# Patient Record
Sex: Male | Born: 1940 | Race: White | Hispanic: No | Marital: Married | Smoking: Never smoker
Health system: Southern US, Community
[De-identification: ages and names within clinical notes are randomized; demographics above are authoritative.]

## PROBLEM LIST (undated history)

## (undated) DIAGNOSIS — M1990 Unspecified osteoarthritis, unspecified site: Secondary | ICD-10-CM

## (undated) DIAGNOSIS — Z87442 Personal history of urinary calculi: Secondary | ICD-10-CM

## (undated) DIAGNOSIS — I639 Cerebral infarction, unspecified: Secondary | ICD-10-CM

## (undated) DIAGNOSIS — K219 Gastro-esophageal reflux disease without esophagitis: Secondary | ICD-10-CM

## (undated) DIAGNOSIS — E119 Type 2 diabetes mellitus without complications: Secondary | ICD-10-CM

## (undated) DIAGNOSIS — I10 Essential (primary) hypertension: Secondary | ICD-10-CM

## (undated) HISTORY — PX: BACK SURGERY: SHX140

## (undated) HISTORY — PX: THROAT SURGERY: SHX803

## (undated) HISTORY — PX: LITHOTRIPSY: SUR834

## (undated) HISTORY — PX: ROTATOR CUFF REPAIR: SHX139

---

## 1998-10-22 ENCOUNTER — Encounter: Payer: Self-pay | Admitting: *Deleted

## 1998-10-22 ENCOUNTER — Ambulatory Visit (HOSPITAL_COMMUNITY): Admission: RE | Admit: 1998-10-22 | Discharge: 1998-10-22 | Payer: Self-pay | Admitting: *Deleted

## 2004-11-24 ENCOUNTER — Ambulatory Visit (HOSPITAL_COMMUNITY): Admission: RE | Admit: 2004-11-24 | Discharge: 2004-11-24 | Payer: Self-pay | Admitting: Gastroenterology

## 2005-09-05 ENCOUNTER — Encounter: Admission: RE | Admit: 2005-09-05 | Discharge: 2005-09-05 | Payer: Self-pay | Admitting: Internal Medicine

## 2005-09-20 ENCOUNTER — Ambulatory Visit (HOSPITAL_BASED_OUTPATIENT_CLINIC_OR_DEPARTMENT_OTHER): Admission: RE | Admit: 2005-09-20 | Discharge: 2005-09-20 | Payer: Self-pay | Admitting: Orthopaedic Surgery

## 2006-07-20 ENCOUNTER — Ambulatory Visit (HOSPITAL_COMMUNITY): Admission: RE | Admit: 2006-07-20 | Discharge: 2006-07-20 | Payer: Self-pay | Admitting: Urology

## 2007-10-22 ENCOUNTER — Encounter: Admission: RE | Admit: 2007-10-22 | Discharge: 2007-10-22 | Payer: Self-pay | Admitting: Internal Medicine

## 2007-12-10 ENCOUNTER — Encounter: Admission: RE | Admit: 2007-12-10 | Discharge: 2007-12-10 | Payer: Self-pay | Admitting: Neurological Surgery

## 2010-10-09 ENCOUNTER — Encounter: Payer: Self-pay | Admitting: Internal Medicine

## 2011-01-24 ENCOUNTER — Other Ambulatory Visit: Payer: Self-pay | Admitting: Neurological Surgery

## 2011-01-24 DIAGNOSIS — M545 Low back pain: Secondary | ICD-10-CM

## 2011-01-27 ENCOUNTER — Ambulatory Visit
Admission: RE | Admit: 2011-01-27 | Discharge: 2011-01-27 | Disposition: A | Discharge status: Home / Self Care | Payer: Medicare Other | Source: Ambulatory Visit | Attending: Neurological Surgery | Admitting: Neurological Surgery

## 2011-01-27 DIAGNOSIS — M545 Low back pain: Secondary | ICD-10-CM

## 2011-02-04 NOTE — Op Note (Signed)
NAME:  [name], [name] NO.:  [id number]   MEDICAL RECORD NO.:  [id number]          PATIENT TYPE:  AMB   LOCATION:  ENDO                         FACILITY:  [facility]   PHYSICIAN:  Danise Edge, M.D.   DATE OF BIRTH:  May 23, 1941   DATE OF PROCEDURE:  11/24/2004  DATE OF DISCHARGE:                                 OPERATIVE REPORT   PROCEDURE INDICATIONS:  [name] is a 70 year old male born  07-20-1941. [name] is referred to me by Dr. Theressa Millard to  perform a screening colonoscopy with polypectomy to prevent colon cancer.   ENDOSCOPIST:  Danise Edge, M.D.   PREMEDICATION:  Versed 10 mg, Demerol 50 mg.   PROCEDURE:  After obtaining informed consent, [name] was placed in the  left lateral decubitus position. I administered intravenous Demerol and  intravenous Versed to achieve conscious sedation for the procedure. The  patient's blood pressure, oxygen saturation and cardiac rhythm were  monitored throughout the procedure and documented in the medical record.   Anal inspection and digital rectal exam were normal. The prostate was  nonnodular. The Olympus adjustable pediatric colonoscope was introduced into  the rectum and advanced to cecum. Colonic preparation for the exam today was  excellent.   Rectum normal.  Sigmoid colon and descending colon normal.  Splenic flexure normal.  Transverse colon normal.  Hepatic flexure normal.  Ascending colon normal.  Cecum and ileocecal valve normal.   ASSESSMENT:  Normal screening proctocolonoscopy to the cecum.     MJ/MEDQ  D:  11/24/2004  T:  11/24/2004  Job:  [id number]   cc:   Theressa Millard, M.D.  [address] [place]  [place] [address]  Fax: [phone]

## 2011-02-04 NOTE — Op Note (Signed)
NAME:  [name] NO.:  [id number]   MEDICAL RECORD NO.:  [id number]          PATIENT TYPE:  AMB   LOCATION:  DSC                          FACILITY:  [facility]   PHYSICIAN:  Lubertha Basque. Dalldorf, M.D.DATE OF BIRTH:  06/03/1941   DATE OF PROCEDURE:  09/20/2005  DATE OF DISCHARGE:                                 OPERATIVE REPORT   PREOPERATIVE DIAGNOSES:  1.  Left shoulder impingement.  2.  Left shoulder acromioclavicular joint degeneration.  3.  Left shoulder rotator cuff tear.   POSTOPERATIVE DIAGNOSES:  1.  Left shoulder impingement.  2.  Left shoulder acromioclavicular joint degeneration.  3.  Left shoulder partial rotator cuff tear.   PROCEDURES:  1.  Left shoulder arthroscopic acromioplasty.  2.  Left shoulder arthroscopic partial claviculectomy.  3.  Left shoulder arthroscopic debridement, partial-thickness rotator cuff      tear.   ANESTHESIA:  General and block.   ATTENDING SURGEON:  Lubertha Basque. Jerl Santos, M.D.   ASSISTANT:  Lindwood Qua, P.A.   INDICATIONS FOR PROCEDURE:  The patient is a 70 year old man with many  months of left shoulder pain after an injury in the garden.  This has  persisted despite oral anti-inflammatories and an injection, which did help  for several days.  At this point he has difficulty using his arm out to the  side and over head and cannot sleep on this side.  He is offered an  arthroscopy.  Informed operative consent was obtained after discussion of  the possible complications of and reaction to anesthesia and infection.  He  is status post a successful procedure on the opposite shoulder several years  ago.   DESCRIPTION OF PROCEDURE:  The patient was taken to the operating suite,  where a general anesthetic was applied without difficulty. He was also given  a block of the preanesthesia area.  He was positioned in beach-chair  position and prepped and draped in normal sterile fashion.  After the  administration of  preop IV Kefzol, an arthroscopy of the left shoulder was  performed through a total of two portals.  The glenohumeral joint showed no  degenerative changes, and the biceps tendon appeared intact throughout the  shoulder.  The rotator cuff did appear a bit irregular near the interval,  but no full-thickness tear could be seen.  In the subacromial space he had a  moderate amount of bursitis,  addressed with a thorough debridement.  He did  have a prominent subacromial morphology, addressed with an acromioplasty  back to a flat surface.  This was done with a bur in the lateral position  followed by transfer the bur the posterior position.  He also had a  prominence at the end of the clavicle which appeared to be impinging on the  rotator cuff, and this was removed performing a partial claviculectomy.  I  then examined the rotator cuff thoroughly.  With significant rotation of the  arm, a small area of partial-thickness tearing could be seen on the far  anterior portion of the supraspinatus attachment at the greater tuberosity.  This was  debrided.  This did not consist of more than 50% of the thickness  the rotator cuff.  This was small and appeared to be in an area of  impingement, and my feeling was that it did not warrant a formal repair.  As  a result, we did the debridement and obviously the decompression.  The  shoulder was thoroughly irrigated.  Simple sutures of nylon were used to  loosely reapproximate the portals, followed by Adaptic and dry gauze  dressing with tape.  Estimated blood loss and intraoperative fluids can be  obtained from anesthesia records.   DISPOSITION:  The patient was extubated in the operating room and taken to  the recovery room in stable addition.  Plans were for him to go home the  same day and follow up in the office in less than a week.  I will contact  him by phone tonight.      Lubertha Basque Jerl Santos, M.D.  Electronically Signed     PGD/MEDQ  D:   09/20/2005  T:  09/20/2005  Job:  [id number]

## 2012-05-29 ENCOUNTER — Other Ambulatory Visit: Payer: Self-pay | Admitting: Neurosurgery

## 2012-05-29 DIAGNOSIS — M48061 Spinal stenosis, lumbar region without neurogenic claudication: Secondary | ICD-10-CM

## 2012-06-03 ENCOUNTER — Ambulatory Visit
Admission: RE | Admit: 2012-06-03 | Discharge: 2012-06-03 | Disposition: A | Discharge status: Home / Self Care | Payer: Medicare Other | Source: Ambulatory Visit | Attending: Neurosurgery | Admitting: Neurosurgery

## 2012-06-03 DIAGNOSIS — M48061 Spinal stenosis, lumbar region without neurogenic claudication: Secondary | ICD-10-CM

## 2012-06-19 ENCOUNTER — Other Ambulatory Visit: Payer: Self-pay | Admitting: Neurosurgery

## 2012-07-17 ENCOUNTER — Encounter (HOSPITAL_COMMUNITY): Payer: Self-pay | Admitting: Pharmacy Technician

## 2012-07-24 ENCOUNTER — Encounter (HOSPITAL_COMMUNITY): Payer: Self-pay

## 2012-07-24 ENCOUNTER — Encounter (HOSPITAL_COMMUNITY)
Admission: RE | Admit: 2012-07-24 | Discharge: 2012-07-24 | Disposition: A | Discharge status: Home / Self Care | Payer: Medicare Other | Source: Ambulatory Visit | Attending: Anesthesiology | Admitting: Anesthesiology

## 2012-07-24 ENCOUNTER — Encounter (HOSPITAL_COMMUNITY)
Admission: RE | Admit: 2012-07-24 | Discharge: 2012-07-24 | Disposition: A | Discharge status: Home / Self Care | Payer: Medicare Other | Source: Ambulatory Visit | Attending: Neurosurgery | Admitting: Neurosurgery

## 2012-07-24 HISTORY — DX: Type 2 diabetes mellitus without complications: E11.9

## 2012-07-24 HISTORY — DX: Essential (primary) hypertension: I10

## 2012-07-24 HISTORY — DX: Gastro-esophageal reflux disease without esophagitis: K21.9

## 2012-07-24 LAB — BASIC METABOLIC PANEL
BUN: 18 mg/dL (ref 6–23)
CO2: 30 mEq/L (ref 19–32)
Calcium: 9.9 mg/dL (ref 8.4–10.5)
Chloride: 95 mEq/L — ABNORMAL LOW (ref 96–112)
Creatinine, Ser: 0.99 mg/dL (ref 0.50–1.35)
GFR calc Af Amer: >90 mL/min (ref >90)
GFR calc non Af Amer: 81 mL/min — ABNORMAL LOW (ref >90)
Glucose, Bld: 149 mg/dL — ABNORMAL HIGH (ref 70–99)
Potassium: 3.8 mEq/L (ref 3.5–5.1)
Sodium: 136 mEq/L (ref 135–145)

## 2012-07-24 LAB — CBC
HCT: 45.4 % (ref 39.0–52.0)
Hemoglobin: 16.1 g/dL (ref 13.0–17.0)
MCH: 31.2 pg (ref 26.0–34.0)
MCHC: 35.5 g/dL (ref 30.0–36.0)
MCV: 88 fL (ref 78.0–100.0)
Platelets: 207 10*3/uL (ref 150–400)
RBC: 5.16 MIL/uL (ref 4.22–5.81)
RDW: 12.9 % (ref 11.5–15.5)
WBC: 8.9 10*3/uL (ref 4.0–10.5)

## 2012-07-24 LAB — TYPE AND SCREEN
ABO/RH(D): O POS
Antibody Screen: NEGATIVE

## 2012-07-24 LAB — SURGICAL PCR SCREEN
MRSA, PCR: NEGATIVE
Staphylococcus aureus: POSITIVE — AB

## 2012-07-24 LAB — ABO/RH: ABO/RH(D): O POS

## 2012-07-24 NOTE — Pre-Procedure Instructions (Signed)
20 [name]  07/24/2012   Your procedure is scheduled on:  Wednesday August 01, 2012  Report to [facility] at 6:00 AM.  Call this number if you have problems the morning of surgery: [phone]   Remember:   Do not eat food or drink :After Midnight.      Take these medicines the morning of surgery with A SIP OF WATER: amlodipine,    Do not wear jewelry, make-up or nail polish.  Do not wear lotions, powders, or perfumes.  Do not shave 48 hours prior to surgery. Men may shave face and neck.  Do not bring valuables to the hospital.  Contacts, dentures or bridgework may not be worn into surgery.  Leave suitcase in the car. After surgery it may be brought to your room.  For patients admitted to the hospital, checkout time is 11:00 AM the day of discharge.   Patients discharged the day of surgery will not be allowed to drive home.  Name and phone number of your driver: family / friend  Special Instructions: Shower using CHG 2 nights before surgery and the night before surgery.  If you shower the day of surgery use CHG.  Use special wash - you have one bottle of CHG for all showers.  You should use approximately 1/3 of the bottle for each shower.   Please read over the following fact sheets that you were given: Pain Booklet, Coughing and Deep Breathing, Blood Transfusion Information, MRSA Information and Surgical Site Infection Prevention

## 2012-07-25 NOTE — Consult Note (Signed)
Anesthesia chart review: Patient is a 71 year old male scheduled for L5 Gill procedure with L5-S1 PLIF on 08/01/2012 by Dr. Newell Coral. History includes nonsmoker, hypertension, kidney stones, diabetes mellitus type 2, GERD, prior throat and rotator cuff surgeries.  EKG on 07/24/2012 showed sinus rhythm with first-degree AV block. It was not felt significantly changed from his prior EKG on 09/15/2005.  Chest x-ray on 07/24/2012 showed no active disease of the chest.  Labs noted.  Anticipate he can proceed as planned.  Shonna Chock, PA-C

## 2012-07-31 MED ORDER — CEFAZOLIN SODIUM-DEXTROSE 2-3 GM-% IV SOLR
2.0000 g | INTRAVENOUS | Status: AC
  Administered 2012-08-01: 2 g via INTRAVENOUS
  Filled 2012-07-31: qty 50

## 2012-08-01 ENCOUNTER — Inpatient Hospital Stay (HOSPITAL_COMMUNITY): Payer: Medicare Other

## 2012-08-01 ENCOUNTER — Inpatient Hospital Stay (HOSPITAL_COMMUNITY): Payer: Medicare Other | Admitting: Vascular Surgery

## 2012-08-01 ENCOUNTER — Encounter (HOSPITAL_COMMUNITY): Payer: Self-pay | Admitting: Vascular Surgery

## 2012-08-01 ENCOUNTER — Encounter (HOSPITAL_COMMUNITY)
Admission: RE | Disposition: A | Discharge status: Home / Self Care | Payer: Self-pay | Source: Ambulatory Visit | Attending: Neurosurgery

## 2012-08-01 ENCOUNTER — Encounter (HOSPITAL_COMMUNITY): Payer: Self-pay | Admitting: Anesthesiology

## 2012-08-01 ENCOUNTER — Encounter (HOSPITAL_COMMUNITY): Payer: Self-pay | Admitting: *Deleted

## 2012-08-01 ENCOUNTER — Inpatient Hospital Stay (HOSPITAL_COMMUNITY)
Admission: RE | Admit: 2012-08-01 | Discharge: 2012-08-03 | DRG: 460 | Disposition: A | Discharge status: Home / Self Care | Payer: Medicare Other | Source: Ambulatory Visit | Attending: Neurosurgery | Admitting: Neurosurgery

## 2012-08-01 DIAGNOSIS — I10 Essential (primary) hypertension: Secondary | ICD-10-CM | POA: Diagnosis present

## 2012-08-01 DIAGNOSIS — M4310 Spondylolisthesis, site unspecified: Secondary | ICD-10-CM | POA: Diagnosis present

## 2012-08-01 DIAGNOSIS — Z79899 Other long term (current) drug therapy: Secondary | ICD-10-CM

## 2012-08-01 DIAGNOSIS — E119 Type 2 diabetes mellitus without complications: Secondary | ICD-10-CM | POA: Diagnosis present

## 2012-08-01 DIAGNOSIS — K219 Gastro-esophageal reflux disease without esophagitis: Secondary | ICD-10-CM | POA: Diagnosis present

## 2012-08-01 DIAGNOSIS — M5126 Other intervertebral disc displacement, lumbar region: Principal | ICD-10-CM | POA: Diagnosis present

## 2012-08-01 DIAGNOSIS — Z87442 Personal history of urinary calculi: Secondary | ICD-10-CM

## 2012-08-01 DIAGNOSIS — Z7982 Long term (current) use of aspirin: Secondary | ICD-10-CM

## 2012-08-01 LAB — GLUCOSE, CAPILLARY
Glucose-Capillary: 166 mg/dL — ABNORMAL HIGH (ref 70–99)
Glucose-Capillary: 139 mg/dL — ABNORMAL HIGH (ref 70–99)
Glucose-Capillary: 223 mg/dL — ABNORMAL HIGH (ref 70–99)
Glucose-Capillary: 254 mg/dL — ABNORMAL HIGH (ref 70–99)

## 2012-08-01 SURGERY — POSTERIOR LUMBAR FUSION 1 LEVEL
Anesthesia: General | Site: Back | Wound class: Clean

## 2012-08-01 MED ORDER — SODIUM CHLORIDE 0.9 % IV SOLN
INTRAVENOUS | Status: DC | PRN
  Administered 2012-08-01: 15:00:00 via INTRAVENOUS

## 2012-08-01 MED ORDER — HYDROMORPHONE HCL PF 1 MG/ML IJ SOLN
INTRAMUSCULAR | Status: AC
  Filled 2012-08-01: qty 1

## 2012-08-01 MED ORDER — PIOGLITAZONE HCL 15 MG PO TABS
15.0000 mg | ORAL_TABLET | Freq: Every day | ORAL | Status: DC
  Administered 2012-08-02 – 2012-08-03 (×2): 15 mg via ORAL
  Filled 2012-08-01 (×3): qty 1

## 2012-08-01 MED ORDER — BACITRACIN 50000 UNITS IM SOLR
INTRAMUSCULAR | Status: AC
  Filled 2012-08-01: qty 1

## 2012-08-01 MED ORDER — ZOLPIDEM TARTRATE 5 MG PO TABS: 5.0000 mg | ORAL_TABLET | Freq: Every evening | ORAL | Status: DC | PRN

## 2012-08-01 MED ORDER — LACTATED RINGERS IV SOLN
INTRAVENOUS | Status: DC | PRN
  Administered 2012-08-01 (×3): via INTRAVENOUS

## 2012-08-01 MED ORDER — LIDOCAINE HCL 4 % MT SOLN
OROMUCOSAL | Status: DC | PRN
  Administered 2012-08-01: 4 mL via TOPICAL

## 2012-08-01 MED ORDER — ONDANSETRON HCL 4 MG/2ML IJ SOLN: 4.0000 mg | Freq: Once | INTRAMUSCULAR | Status: DC | PRN

## 2012-08-01 MED ORDER — OXYCODONE HCL 5 MG PO TABS
5.0000 mg | ORAL_TABLET | ORAL | Status: DC | PRN
  Administered 2012-08-01 – 2012-08-02 (×3): 10 mg via ORAL
  Filled 2012-08-01 (×3): qty 2

## 2012-08-01 MED ORDER — ROCURONIUM BROMIDE 100 MG/10ML IV SOLN
INTRAVENOUS | Status: DC | PRN
  Administered 2012-08-01: 50 mg via INTRAVENOUS

## 2012-08-01 MED ORDER — ARTIFICIAL TEARS OP OINT
TOPICAL_OINTMENT | OPHTHALMIC | Status: DC | PRN
  Administered 2012-08-01: 1 via OPHTHALMIC

## 2012-08-01 MED ORDER — AMLODIPINE BESYLATE 5 MG PO TABS
5.0000 mg | ORAL_TABLET | Freq: Every day | ORAL | Status: DC
  Administered 2012-08-02: 5 mg via ORAL
  Filled 2012-08-01 (×2): qty 1

## 2012-08-01 MED ORDER — LIDOCAINE-EPINEPHRINE 1 %-1:100000 IJ SOLN
INTRAMUSCULAR | Status: DC | PRN
  Administered 2012-08-01: 10 mL

## 2012-08-01 MED ORDER — BUPIVACAINE HCL (PF) 0.5 % IJ SOLN
INTRAMUSCULAR | Status: DC | PRN
  Administered 2012-08-01: 10 mL

## 2012-08-01 MED ORDER — SODIUM CHLORIDE 0.9 % IJ SOLN: 3.0000 mL | INTRAMUSCULAR | Status: DC | PRN

## 2012-08-01 MED ORDER — SODIUM CHLORIDE 0.9 % IR SOLN
Status: DC | PRN
  Administered 2012-08-01 (×2)

## 2012-08-01 MED ORDER — CYCLOBENZAPRINE HCL 10 MG PO TABS
10.0000 mg | ORAL_TABLET | Freq: Three times a day (TID) | ORAL | Status: DC | PRN
  Administered 2012-08-02: 10 mg via ORAL
  Filled 2012-08-01: qty 1

## 2012-08-01 MED ORDER — MENTHOL 3 MG MT LOZG: 1.0000 | LOZENGE | OROMUCOSAL | Status: DC | PRN

## 2012-08-01 MED ORDER — KETOROLAC TROMETHAMINE 30 MG/ML IJ SOLN
30.0000 mg | Freq: Four times a day (QID) | INTRAMUSCULAR | Status: DC
  Administered 2012-08-01 – 2012-08-03 (×6): 30 mg via INTRAVENOUS
  Filled 2012-08-01 (×11): qty 1

## 2012-08-01 MED ORDER — HYDROCHLOROTHIAZIDE 25 MG PO TABS
25.0000 mg | ORAL_TABLET | Freq: Every day | ORAL | Status: DC
  Administered 2012-08-01 – 2012-08-02 (×2): 25 mg via ORAL
  Filled 2012-08-01 (×3): qty 1

## 2012-08-01 MED ORDER — ACETAMINOPHEN 325 MG PO TABS: 650.0000 mg | ORAL_TABLET | ORAL | Status: DC | PRN

## 2012-08-01 MED ORDER — METFORMIN HCL 500 MG PO TABS
500.0000 mg | ORAL_TABLET | Freq: Two times a day (BID) | ORAL | Status: DC
  Filled 2012-08-01: qty 1

## 2012-08-01 MED ORDER — 0.9 % SODIUM CHLORIDE (POUR BTL) OPTIME
TOPICAL | Status: DC | PRN
  Administered 2012-08-01: 1000 mL

## 2012-08-01 MED ORDER — LIDOCAINE HCL (CARDIAC) 20 MG/ML IV SOLN
INTRAVENOUS | Status: DC | PRN
  Administered 2012-08-01: 80 mg via INTRAVENOUS

## 2012-08-01 MED ORDER — ALUM & MAG HYDROXIDE-SIMETH 200-200-20 MG/5ML PO SUSP: 30.0000 mL | Freq: Four times a day (QID) | ORAL | Status: DC | PRN

## 2012-08-01 MED ORDER — FAMOTIDINE 20 MG PO TABS
20.0000 mg | ORAL_TABLET | Freq: Two times a day (BID) | ORAL | Status: DC
  Administered 2012-08-01 – 2012-08-02 (×3): 20 mg via ORAL
  Filled 2012-08-01 (×5): qty 1

## 2012-08-01 MED ORDER — ACETAMINOPHEN 650 MG RE SUPP: 650.0000 mg | RECTAL | Status: DC | PRN

## 2012-08-01 MED ORDER — MORPHINE SULFATE 4 MG/ML IJ SOLN
4.0000 mg | INTRAMUSCULAR | Status: DC | PRN
  Administered 2012-08-01: 4 mg via INTRAMUSCULAR

## 2012-08-01 MED ORDER — VECURONIUM BROMIDE 10 MG IV SOLR
INTRAVENOUS | Status: DC | PRN
  Administered 2012-08-01 (×2): 1 mg via INTRAVENOUS
  Administered 2012-08-01: 2 mg via INTRAVENOUS
  Administered 2012-08-01: 1 mg via INTRAVENOUS
  Administered 2012-08-01: 2 mg via INTRAVENOUS

## 2012-08-01 MED ORDER — GLIMEPIRIDE 4 MG PO TABS
4.0000 mg | ORAL_TABLET | Freq: Every day | ORAL | Status: DC
  Administered 2012-08-02 – 2012-08-03 (×2): 4 mg via ORAL
  Filled 2012-08-01 (×3): qty 1

## 2012-08-01 MED ORDER — EPHEDRINE SULFATE 50 MG/ML IJ SOLN
INTRAMUSCULAR | Status: DC | PRN
  Administered 2012-08-01: 5 mg via INTRAVENOUS
  Administered 2012-08-01: 10 mg via INTRAVENOUS
  Administered 2012-08-01 (×2): 5 mg via INTRAVENOUS

## 2012-08-01 MED ORDER — SODIUM CHLORIDE 0.9 % IJ SOLN
3.0000 mL | Freq: Two times a day (BID) | INTRAMUSCULAR | Status: DC
  Administered 2012-08-01 – 2012-08-03 (×4): 3 mL via INTRAVENOUS

## 2012-08-01 MED ORDER — HYDROMORPHONE HCL PF 1 MG/ML IJ SOLN: 0.2500 mg | INTRAMUSCULAR | Status: DC | PRN

## 2012-08-01 MED ORDER — KETOROLAC TROMETHAMINE 30 MG/ML IJ SOLN: 30.0000 mg | Freq: Once | INTRAMUSCULAR | Status: DC

## 2012-08-01 MED ORDER — FENTANYL CITRATE 0.05 MG/ML IJ SOLN
INTRAMUSCULAR | Status: DC | PRN
  Administered 2012-08-01: 50 ug via INTRAVENOUS
  Administered 2012-08-01: 150 ug via INTRAVENOUS
  Administered 2012-08-01 (×6): 50 ug via INTRAVENOUS

## 2012-08-01 MED ORDER — IRBESARTAN 150 MG PO TABS
150.0000 mg | ORAL_TABLET | Freq: Every day | ORAL | Status: DC
  Administered 2012-08-01 – 2012-08-02 (×2): 150 mg via ORAL
  Filled 2012-08-01 (×3): qty 1

## 2012-08-01 MED ORDER — PROPOFOL 10 MG/ML IV BOLUS
INTRAVENOUS | Status: DC | PRN
  Administered 2012-08-01: 200 mg via INTRAVENOUS

## 2012-08-01 MED ORDER — METFORMIN HCL 500 MG PO TABS
500.0000 mg | ORAL_TABLET | Freq: Two times a day (BID) | ORAL | Status: DC
  Administered 2012-08-01 – 2012-08-03 (×4): 500 mg via ORAL
  Filled 2012-08-01 (×6): qty 1

## 2012-08-01 MED ORDER — SODIUM CHLORIDE 0.9 % IV SOLN
INTRAVENOUS | Status: AC
  Filled 2012-08-01: qty 500

## 2012-08-01 MED ORDER — KCL IN DEXTROSE-NACL 20-5-0.45 MEQ/L-%-% IV SOLN
INTRAVENOUS | Status: AC
  Filled 2012-08-01: qty 1000

## 2012-08-01 MED ORDER — PHENOL 1.4 % MT LIQD: 1.0000 | OROMUCOSAL | Status: DC | PRN

## 2012-08-01 MED ORDER — ACETAMINOPHEN 10 MG/ML IV SOLN
1000.0000 mg | Freq: Four times a day (QID) | INTRAVENOUS | Status: AC
  Administered 2012-08-01 – 2012-08-02 (×4): 1000 mg via INTRAVENOUS
  Filled 2012-08-01 (×4): qty 100

## 2012-08-01 MED ORDER — ONDANSETRON HCL 4 MG/2ML IJ SOLN
INTRAMUSCULAR | Status: DC | PRN
  Administered 2012-08-01: 4 mg via INTRAVENOUS

## 2012-08-01 MED ORDER — HYDROXYZINE HCL 25 MG PO TABS: 50.0000 mg | ORAL_TABLET | ORAL | Status: DC | PRN

## 2012-08-01 MED ORDER — MORPHINE SULFATE 4 MG/ML IJ SOLN
INTRAMUSCULAR | Status: AC
  Filled 2012-08-01: qty 1

## 2012-08-01 MED ORDER — MAGNESIUM HYDROXIDE 400 MG/5ML PO SUSP: 30.0000 mL | Freq: Every day | ORAL | Status: DC | PRN

## 2012-08-01 MED ORDER — ACETAMINOPHEN 10 MG/ML IV SOLN
INTRAVENOUS | Status: AC
  Filled 2012-08-01: qty 100

## 2012-08-01 MED ORDER — THROMBIN 20000 UNITS EX SOLR
CUTANEOUS | Status: DC | PRN
  Administered 2012-08-01: 13:00:00 via TOPICAL

## 2012-08-01 MED ORDER — KCL IN DEXTROSE-NACL 20-5-0.45 MEQ/L-%-% IV SOLN
INTRAVENOUS | Status: DC
  Administered 2012-08-01: 17:00:00 via INTRAVENOUS
  Filled 2012-08-01 (×6): qty 1000

## 2012-08-01 MED ORDER — HYDROXYZINE HCL 50 MG/ML IM SOLN: 50.0000 mg | INTRAMUSCULAR | Status: DC | PRN

## 2012-08-01 MED ORDER — BISACODYL 10 MG RE SUPP: 10.0000 mg | Freq: Every day | RECTAL | Status: DC | PRN

## 2012-08-01 SURGICAL SUPPLY — 68 items
ADH SKN CLS APL DERMABOND .7 (GAUZE/BANDAGES/DRESSINGS) ×1
BAG DECANTER FOR FLEXI CONT (MISCELLANEOUS) ×2 IMPLANT
BLADE SURG ROTATE 9660 (MISCELLANEOUS) IMPLANT
BRUSH SCRUB EZ PLAIN DRY (MISCELLANEOUS) ×2 IMPLANT
BUR ACRON 5.0MM COATED (BURR) ×2 IMPLANT
BUR MATCHSTICK NEURO 3.0 LAGG (BURR) ×2 IMPLANT
CANISTER SUCTION 2500CC (MISCELLANEOUS) ×2 IMPLANT
CAP LCK SPNE (Orthopedic Implant) ×4 IMPLANT
CAP LOCK SPINE RADIUS (Orthopedic Implant) IMPLANT
CAP LOCKING (Orthopedic Implant) ×8 IMPLANT
CLOTH BEACON ORANGE TIMEOUT ST (SAFETY) ×2 IMPLANT
CONT SPEC 4OZ CLIKSEAL STRL BL (MISCELLANEOUS) ×2 IMPLANT
COVER BACK TABLE 24X17X13 BIG (DRAPES) IMPLANT
COVER TABLE BACK 60X90 (DRAPES) ×2 IMPLANT
DERMABOND ADVANCED (GAUZE/BANDAGES/DRESSINGS) ×1
DERMABOND ADVANCED .7 DNX12 (GAUZE/BANDAGES/DRESSINGS) ×2 IMPLANT
DRAPE C-ARM 42X72 X-RAY (DRAPES) ×4 IMPLANT
DRAPE LAPAROTOMY 100X72X124 (DRAPES) ×2 IMPLANT
DRAPE POUCH INSTRU U-SHP 10X18 (DRAPES) ×2 IMPLANT
DRAPE PROXIMA HALF (DRAPES) IMPLANT
DRSG EMULSION OIL 3X3 NADH (GAUZE/BANDAGES/DRESSINGS) IMPLANT
ELECT REM PT RETURN 9FT ADLT (ELECTROSURGICAL) ×2
ELECTRODE REM PT RTRN 9FT ADLT (ELECTROSURGICAL) ×1 IMPLANT
GAUZE SPONGE 4X4 16PLY XRAY LF (GAUZE/BANDAGES/DRESSINGS) IMPLANT
GLOVE BIOGEL PI IND STRL 8 (GLOVE) ×2 IMPLANT
GLOVE BIOGEL PI INDICATOR 8 (GLOVE) ×2
GLOVE ECLIPSE 7.5 STRL STRAW (GLOVE) ×4 IMPLANT
GLOVE EXAM NITRILE LRG STRL (GLOVE) IMPLANT
GLOVE EXAM NITRILE MD LF STRL (GLOVE) ×2 IMPLANT
GLOVE EXAM NITRILE XL STR (GLOVE) IMPLANT
GLOVE EXAM NITRILE XS STR PU (GLOVE) IMPLANT
GOWN BRE IMP SLV AUR LG STRL (GOWN DISPOSABLE) ×2 IMPLANT
GOWN BRE IMP SLV AUR XL STRL (GOWN DISPOSABLE) ×4 IMPLANT
GOWN STRL REIN 2XL LVL4 (GOWN DISPOSABLE) ×2 IMPLANT
KIT BASIN OR (CUSTOM PROCEDURE TRAY) ×2 IMPLANT
KIT INFUSE SMALL (Orthopedic Implant) ×1 IMPLANT
KIT ROOM TURNOVER OR (KITS) ×2 IMPLANT
MILL MEDIUM DISP (BLADE) ×3 IMPLANT
NDL SPNL 18GX3.5 QUINCKE PK (NEEDLE) IMPLANT
NDL SPNL 22GX3.5 QUINCKE BK (NEEDLE) ×2 IMPLANT
NEEDLE BONE MARROW 8GAX6 (NEEDLE) ×1 IMPLANT
NEEDLE SPNL 18GX3.5 QUINCKE PK (NEEDLE) IMPLANT
NEEDLE SPNL 22GX3.5 QUINCKE BK (NEEDLE) ×2 IMPLANT
NS IRRIG 1000ML POUR BTL (IV SOLUTION) ×2 IMPLANT
PACK LAMINECTOMY NEURO (CUSTOM PROCEDURE TRAY) ×2 IMPLANT
PAD ARMBOARD 7.5X6 YLW CONV (MISCELLANEOUS) ×6 IMPLANT
PATTIES SURGICAL .5 X.5 (GAUZE/BANDAGES/DRESSINGS) IMPLANT
PATTIES SURGICAL .5 X1 (DISPOSABLE) IMPLANT
PATTIES SURGICAL 1X1 (DISPOSABLE) IMPLANT
PEEK PLIF AVS 13X25X4 (Peek) ×2 IMPLANT
ROD 5.5X30MM (Rod) ×1 IMPLANT
ROD RADIUS 35MM (Rod) ×1 IMPLANT
SCREW 5.75X40M (Screw) ×2 IMPLANT
SCREW 6.75X35MM (Screw) ×2 IMPLANT
SPONGE GAUZE 4X4 12PLY (GAUZE/BANDAGES/DRESSINGS) ×1 IMPLANT
SPONGE LAP 4X18 X RAY DECT (DISPOSABLE) IMPLANT
SPONGE NEURO XRAY DETECT 1X3 (DISPOSABLE) ×1 IMPLANT
SPONGE SURGIFOAM ABS GEL 100 (HEMOSTASIS) ×2 IMPLANT
STRIP BIOACTIVE VITOSS 25X100X (Neuro Prosthesis/Implant) ×1 IMPLANT
SUT VIC AB 1 CT1 18XBRD ANBCTR (SUTURE) ×2 IMPLANT
SUT VIC AB 1 CT1 8-18 (SUTURE) ×4
SUT VIC AB 2-0 CP2 18 (SUTURE) ×4 IMPLANT
SYR 20ML ECCENTRIC (SYRINGE) ×2 IMPLANT
SYR CONTROL 10ML LL (SYRINGE) ×4 IMPLANT
TOWEL OR 17X24 6PK STRL BLUE (TOWEL DISPOSABLE) ×2 IMPLANT
TOWEL OR 17X26 10 PK STRL BLUE (TOWEL DISPOSABLE) ×2 IMPLANT
TRAY FOLEY CATH 14FRSI W/METER (CATHETERS) ×2 IMPLANT
WATER STERILE IRR 1000ML POUR (IV SOLUTION) ×2 IMPLANT

## 2012-08-01 NOTE — Anesthesia Postprocedure Evaluation (Signed)
  Anesthesia Post-op Note  Patient: [name]  Procedure(s) Performed: Procedure(s) (LRB) with comments: POSTERIOR LUMBAR FUSION 1 LEVEL (N/A) - Lumbar Five Gill Procedure with Lumbar Five Sacral One Posterior Lumbar Interbody Fusion with Interbody Prothesis Posterolateral Arthrodesis and Posterior Nonsegmental Instrumentation  Patient Location: PACU  Anesthesia Type:General  Level of Consciousness: awake, oriented, sedated and patient cooperative  Airway and Oxygen Therapy: Patient Spontanous Breathing  Post-op Pain: mild  Post-op Assessment: Post-op Vital signs reviewed, Patient's Cardiovascular Status Stable, Respiratory Function Stable, Patent Airway, No signs of Nausea or vomiting and Pain level controlled  Post-op Vital Signs: stable  Complications: No apparent anesthesia complications

## 2012-08-01 NOTE — Transfer of Care (Signed)
Immediate Anesthesia Transfer of Care Note  Patient: [name]  Procedure(s) Performed: Procedure(s) (LRB) with comments: POSTERIOR LUMBAR FUSION 1 LEVEL (N/A) - Lumbar Five Gill Procedure with Lumbar Five Sacral One Posterior Lumbar Interbody Fusion with Interbody Prothesis Posterolateral Arthrodesis and Posterior Nonsegmental Instrumentation  Patient Location: PACU  Anesthesia Type:General  Level of Consciousness: awake, alert  and oriented  Airway & Oxygen Therapy: Patient Spontanous Breathing and Patient connected to face mask oxygen  Post-op Assessment: Report given to PACU RN  Post vital signs: Reviewed and stable  Complications: No apparent anesthesia complications

## 2012-08-01 NOTE — H&P (Signed)
Subjective:  Patient is a 71 y.o. male who is admitted for treatment of right L5-S1 lumbar disc herniation, bilateral L5 pars interarticularis defect, anterolisthesis of L5 and S1, and right lumbar radiculopathy.  Patient had difficulties with his low back and radicular pain for over 4 years. He has been treated with physical therapy, NSAIDS, and spinal injections. He's had increased difficulty recently with pain from his low back rating down to the right lower extremity, and the new MRI revealed a new right L5-S1 lumbar disc herniation with thecal sac and nerve root compression. Patient is admitted now for a L5 Gill procedure, L5-S1 posterior lumbar interbody arthrodesis, and L5-S1 posterior lateral arthrodesis with interbody implants, posterior instrumentation, and bone graft. We've discussed further nonsurgical management and I discussed alternatives surgical procedures, the patient was to proceed with surgery and is admitted for such.   Past Medical History  Diagnosis Date  . Hypertension   . Diabetes mellitus without complication     oral medications  . Kidney stones     hx of  . GERD (gastroesophageal reflux disease)     Past Surgical History  Procedure Date  . Rotator cuff repair     bilateral shoulder  . Throat surgery     benign tumor removed    Prescriptions prior to admission  Medication Sig Dispense Refill  . amLODipine (NORVASC) 5 MG tablet Take 5 mg by mouth daily.      Marland Kitchen aspirin EC 81 MG tablet Take 81 mg by mouth daily.      . fish oil-omega-3 fatty acids 1000 MG capsule Take 1 g by mouth daily.      Marland Kitchen glimepiride (AMARYL) 4 MG tablet Take 4 mg by mouth daily.      . hydrochlorothiazide (HYDRODIURIL) 25 MG tablet Take 25 mg by mouth daily.      . metFORMIN (GLUCOPHAGE) 500 MG tablet Take 500 mg by mouth 2 (two) times daily with a meal.      . Multiple Vitamin (MULTIVITAMIN WITH MINERALS) TABS Take 1 tablet by mouth daily.      Marland Kitchen olmesartan (BENICAR) 20 MG tablet Take 20  mg by mouth daily.      . pioglitazone (ACTOS) 15 MG tablet Take 15 mg by mouth daily.      . ranitidine (ZANTAC) 150 MG tablet Take 150 mg by mouth 2 (two) times daily.       No Known Allergies  History  Substance Use Topics  . Smoking status: Never Smoker   . Smokeless tobacco: Not on file  . Alcohol Use: No    History reviewed. No pertinent family history.   Review of Systems A comprehensive review of systems was negative.  Objective: Vital signs in last 24 hours: Temp:  [97.4 F (36.3 C)] 97.4 F (36.3 C) (11/13 0817) Pulse Rate:  [80] 80  (11/13 0817) Resp:  [18] 18  (11/13 0817) BP: (151)/(83) 151/83 mmHg (11/13 0817) SpO2:  [97 %] 97 % (11/13 0817)  EXAM: Patient is a well-developed well-nourished white male in no acute distress. Lungs are clear to auscultation , the patient has symmetrical respiratory excursion. Heart has a regular rate and rhythm normal S1 and S2 no murmur.   Abdomen is soft nontender nondistended bowel sounds are present. Extremity examination shows no clubbing cyanosis or edema. Musculoskeletal examination shows no tenderness to palpation of the lumbar spinous process or paraspinal musculature. However mobility is limited. Flexion is limited about 45 due to discomfort , and he  has discomfort with extension at about 5-10. Straight leg raising is negative on the left but possibly the right at about 80-90 with pain into the right side of his low back. Motor examination shows 5 over 5 strength in the lower extremities including the iliopsoas quadriceps dorsiflexor extensor hallicus  longus and plantar flexor bilaterally. Sensation is intact to pinprick in the distal lower extremities. Reflexes are symmetrical bilaterally. No pathologic reflexes are present. Patient has a normal gait and stance.   Data Review:CBC    Component Value Date/Time   WBC 8.9 07/24/2012 1147   RBC 5.16 07/24/2012 1147   HGB 16.1 07/24/2012 1147   HCT 45.4 07/24/2012 1147   PLT 207  07/24/2012 1147   MCV 88.0 07/24/2012 1147   MCH 31.2 07/24/2012 1147   MCHC 35.5 07/24/2012 1147   RDW 12.9 07/24/2012 1147                          BMET    Component Value Date/Time   NA 136 07/24/2012 1147   K 3.8 07/24/2012 1147   CL 95* 07/24/2012 1147   CO2 30 07/24/2012 1147   GLUCOSE 149* 07/24/2012 1147   BUN 18 07/24/2012 1147   CREATININE 0.99 07/24/2012 1147   CALCIUM 9.9 07/24/2012 1147   GFRNONAA 81* 07/24/2012 1147   GFRAA >90 07/24/2012 1147     Assessment/Plan: Patient with the bilateral L5 pars interarticularis defect with the anterolisthesis of L5-S1, with a right L5 smaller disc herniation, with thecal sac and nerve root compression, who is admitted for an L5-S1 decompression, PLIF, and PLA.  I've discussed with the patient the nature of his condition, the nature the surgical procedure, the typical length of surgery, hospital stay, and overall recuperation, the limitations postoperatively, and risks of surgery. I discussed risks including risks of infection, bleeding, possibly need for transfusion, the risk of nerve root dysfunction with pain, weakness, numbness, or paresthesias, the risk of dural tear and CSF leakage and possible need for further surgery, the risk of failure of the arthrodesis and possibly for further surgery, the risk of anesthetic complications including myocardial infarction, stroke, pneumonia, and death. We discussed the need for postoperative immobilization in a lumbar brace. Understanding all this the patient does wish to proceed with surgery and is admitted for such.     Hewitt Shorts, MD 08/01/2012 10:47 AM

## 2012-08-01 NOTE — Op Note (Signed)
08/01/2012  3:50 PM  PATIENT:  [name]  71 y.o. 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, lumbar degenerative disc disease, lumbar spondylosis, lumbar radiculopathy  PROCEDURE:  Procedure(s): POSTERIOR LUMBAR FUSION 1 LEVEL: L5 Gill procedure, bilateral L5-S1 facetectomy and foraminotomies, with decompression of the L5 and S1 nerve roots bilaterally, with decompression beyond that required for interbody arthrodesis, with microdissection and microsurgical technique, bilateral L5-S1 posterior lumbar interbody arthrodesis with AVS peek interbody implants and locally harvested morcellized autograft and infuse, and bilateral L5-S1 posterior lateral arthrodesis with radius posterior instrumentation, Vitoss BA with bone marrow aspirate and infuse  SURGEON:  Surgeon(s): Hewitt Shorts, MD  ASSISTANTS: Coletta Memos, MD  ANESTHESIA:   general  EBL:  Total I/O In: 3135 [I.V.:2950; Blood:185] Out: 800 [Urine:300; Blood:500]  BLOOD ADMINISTERED:185 CC CELLSAVER  COUNT: Correct per nursing staff  DICTATION: Patient is brought to the operating room placed under general endotracheal anesthesia. The patient was turned to prone position the lumbar region was prepped with Betadine soap and solution and draped in a sterile fashion. The midline was infiltrated with local anesthesia with epinephrine. A localizing x-ray was taken and then a midline incision was made carried down through the subcutaneous tissue, bipolar cautery and electrocautery were used to maintain hemostasis. Dissection was carried down to the lumbar fascia. The fascia was incised bilaterally and  the paraspinal muscles were dissected with a spinous process and lamina in a subperiosteal fashion. Another x-ray was taken for localization and the L5-S1 level was localized. Dissection was then carried out laterally over the facet complex and the transverse processes of L5 and the ala of S1 were exposed and decorticated. Decompression was performed by performing a L5 Gill procedure. We're able to mobilize the posterior elements of L5, carefully separating and opening the ligamentous attachments. The posterior elements were removed en bloc, and they were cleaned of cartilage and other soft tissue, and then morselized in a bone mill, to be later used as autograft implants. We continued the decompression laterally, removing hypertrophic pseudoarthrotic overgrowth, the medial facets of S1, and were thereby able to decompress the exiting L5 nerve roots as they exited the L5-S1 neural foramina bilaterally. The stenotic compression of the L5 and S1 nerve roots was thereby decompressed. Once the decompression stenotic compression of the thecal sac and exiting nerve roots was completed we proceeded with the posterior lumbar interbody arthrodesis. The annulus was incised bilaterally and the disc space entered. There was a large fragment on the right side that had extended rostrally above the disc space, compressing the exiting right L5 nerve root. This was removed as well decompressing the thecal sac and nerve root. A thorough discectomy was performed using pituitary rongeurs and curettes. Once the discectomy was completed we began to prepare the endplate surfaces removing the cartilaginous endplates surface with paddle curettes. We then measured the height of the intervertebral disc space. We selected 13 x 25 x 4 AVS peek interbody implants.  The C-arm fluoroscope was then draped and brought in the field and we identified the pedicle entry points bilaterally at the L5-S1 levels. Each of the 4 pedicles was probed, we  aspirated bone marrow aspirate from the vertebral bodies, this was injected over a 10  cc strip of Vitoss BA. Then each of the pedicles was examined with the ball probe good bony surfaces were found and no bony cuts were found. Each of the L5 pedicles was then tapped with a 5.25 mm tap, again examined with the ball probe good threading was found and no bony cuts were found. We then placed 5.75 by 40 millimeter screws bilaterally at the L5 level. Each of the S1 pedicles was then tapped with a 6.25 mm tap, again examined with the ball probe, good threading was found and no bony that were found. We then placed 6.75 x 35 mm screws bilaterally at the S1 level.  We then packed the AVS peek interbody implants with locally harvested morcellized autograft and infuse, and then placed the first implant and on the right side, carefully retracting the thecal sac and nerve root medially. We then went back to the left side and packed the midline with additional locally harvested morcellized autograft and infuse and then placed a second implant and on the left side again retracting the thecal sac and nerve root medially. Additional locally harvested morcellized autograft and infuse was packed lateral to the implants.  We then packed the lateral gutter over the transverse processes and intertransverse space with Vitoss BA with bone marrow aspirate and infuse. We then selected a 35 mm rod for the left side and a 30 mm rod on the right side.  They were placed within the screw heads and secured with locking caps, once all 4 locking caps were placed final tightening was performed against a counter torque.  The wound had been irrigated multiple times during the procedure with saline solution and bacitracin solution, good hemostasis was established with a combination of bipolar cautery and Gelfoam with thrombin. Once good hemostasis was confirmed we proceeded with closure paraspinal muscles deep fascia and Scarpa's fascia were closed  with interrupted undyed 1 Vicryl sutures the subcutaneous and subcuticular closed with interrupted inverted 2-0 undyed Vicryl sutures the skin edges were approximated with Dermabond.  Following surgery the patient was turned back to the supine position to be reversed and the anesthetic extubated and transferred to the recovery room for further care.   PLAN OF CARE: Admit to inpatient   PATIENT DISPOSITION:  PACU - hemodynamically stable.   Delay start of Pharmacological VTE agent (>24hrs) due to surgical blood loss or risk of bleeding:  yes

## 2012-08-01 NOTE — Anesthesia Preprocedure Evaluation (Signed)
Anesthesia Evaluation  Patient identified by MRN, date of birth, ID band Patient awake    Reviewed: Allergy & Precautions, H&P , NPO status , Patient's Chart, lab work & pertinent test results  Airway Mallampati: I TM Distance: >3 FB Neck ROM: full    Dental   Pulmonary          Cardiovascular hypertension, On Medications Rhythm:regular Rate:Normal     Neuro/Psych    GI/Hepatic GERD-  ,  Endo/Other  diabetes, Well Controlled, Type 2, Oral Hypoglycemic Agents  Renal/GU Renal disease     Musculoskeletal   Abdominal   Peds  Hematology   Anesthesia Other Findings   Reproductive/Obstetrics                           Anesthesia Physical Anesthesia Plan  ASA: III  Anesthesia Plan: General   Post-op Pain Management:    Induction: Intravenous  Airway Management Planned: Oral ETT  Additional Equipment:   Intra-op Plan:   Post-operative Plan: Extubation in OR  Informed Consent: I have reviewed the patients History and Physical, chart, labs and discussed the procedure including the risks, benefits and alternatives for the proposed anesthesia with the patient or authorized representative who has indicated his/her understanding and acceptance.     Plan Discussed with: CRNA, Anesthesiologist and Surgeon  Anesthesia Plan Comments:         Anesthesia Quick Evaluation

## 2012-08-01 NOTE — Plan of Care (Signed)
Problem: Consults Goal: Diagnosis - Spinal Surgery Outcome: Completed/Met Date Met:  08/01/12 Thoraco/Lumbar Spine Fusion     

## 2012-08-01 NOTE — Preoperative (Signed)
Beta Blockers   Reason not to administer Beta Blockers:Not Applicable 

## 2012-08-01 NOTE — Progress Notes (Signed)
Filed Vitals:   08/01/12 0817 08/01/12 1550 08/01/12 1715  BP: 151/83 144/65   Pulse: 80 72   Temp: 97.4 F (36.3 C) 98 F (36.7 C) 97.6 F (36.4 C)  Resp: 18 12   SpO2: 97% 99%     Patient resting comfortably in bed. Wound clean and dry. Moving all 4 extremities well. Asking for dinner. Foley to straight drainage, will DC in a.m.  Plan: Doing well following surgery. We'll plan to progress to postoperative recovery. Spoke with patient, his wife, and his children about her plans for treatment and care through the hospitalization, as well as our discharge instructions for him.  Hewitt Shorts, MD 08/01/2012, 6:46 PM

## 2012-08-02 LAB — GLUCOSE, CAPILLARY
Glucose-Capillary: 187 mg/dL — ABNORMAL HIGH (ref 70–99)
Glucose-Capillary: 308 mg/dL — ABNORMAL HIGH (ref 70–99)
Glucose-Capillary: 188 mg/dL — ABNORMAL HIGH (ref 70–99)
Glucose-Capillary: 191 mg/dL — ABNORMAL HIGH (ref 70–99)

## 2012-08-02 NOTE — Progress Notes (Signed)
UR COMPLETED  

## 2012-08-02 NOTE — Progress Notes (Signed)
Filed Vitals:   08/01/12 2001 08/02/12 0000 08/02/12 0358 08/02/12 0807  BP: 132/60 134/74 100/58 111/63  Pulse: 104 96 90 89  Temp: 98.1 F (36.7 C) 98.6 F (37 C) 98.5 F (36.9 C) 98.5 F (36.9 C)  Resp: 18 18 18 18   SpO2: 99% 96% 96% 97%    Patient sitting up, comfortable. Has been up and ambulate in the halls. Wound clean and dry. Has voided small-volume since Foley was discontinued, but we'll check PVR after next void. Encouraged to continue to ambulate actively in halls.  Plan: Continued to progress to postoperative recovery.  Hewitt Shorts, MD 08/02/2012, 10:58 AM

## 2012-08-03 LAB — GLUCOSE, CAPILLARY: Glucose-Capillary: 198 mg/dL — ABNORMAL HIGH (ref 70–99)

## 2012-08-03 MED ORDER — OXYCODONE-ACETAMINOPHEN 5-325 MG PO TABS: 1.0000 | ORAL_TABLET | ORAL | Status: DC | PRN

## 2012-08-03 MED ORDER — CYCLOBENZAPRINE HCL 10 MG PO TABS: 10.0000 mg | ORAL_TABLET | Freq: Three times a day (TID) | ORAL | Status: DC | PRN

## 2012-08-03 MED FILL — Sodium Chloride IV Soln 0.9%: INTRAVENOUS | Qty: 3000 | Status: AC

## 2012-08-03 MED FILL — Heparin Sodium (Porcine) Inj 1000 Unit/ML: INTRAMUSCULAR | Qty: 30 | Status: AC

## 2012-08-03 NOTE — Discharge Summary (Signed)
Physician Discharge Summary  Patient ID: [name] MRN: [id number] DOB/AGE: 70-Aug-1942 70 y.o.  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 Postoperatively he has done well. He has been up and ambulating in the halls. He has voided well since his Foley catheter was discontinued. His wound is clean and dry. He is asking to be discharged and we have given him discharge instructions. He is to return for followup with me in the office in 3 weeks.  Discharge Exam: Blood pressure 114/70, pulse 84, temperature 99.8 F (37.7 C), resp. rate 18, SpO2 96.00%.  Disposition: Home     Medication List     As of 08/03/2012  7:57 AM    TAKE these medications         amLODipine 5 MG tablet   Commonly known as: NORVASC   Take 5 mg by mouth daily.      aspirin EC 81 MG tablet   Take 81 mg by mouth daily.      cyclobenzaprine 10 MG tablet   Commonly known as: FLEXERIL   Take 1 tablet (10 mg total) by mouth 3 (three) times daily as needed for muscle spasms.      fish oil-omega-3 fatty acids 1000 MG capsule   Take 1 g by mouth daily.      glimepiride 4 MG tablet   Commonly known as: AMARYL   Take 4 mg by mouth daily.      hydrochlorothiazide 25 MG tablet   Commonly known as: HYDRODIURIL   Take 25 mg by mouth daily.      metFORMIN 500 MG tablet   Commonly known as: GLUCOPHAGE   Take 500 mg by mouth 2 (two) times daily with a meal.      multivitamin with  minerals Tabs   Take 1 tablet by mouth daily.      olmesartan 20 MG tablet   Commonly known as: BENICAR   Take 20 mg by mouth daily.      oxyCODONE-acetaminophen 5-325 MG per tablet   Commonly known as: PERCOCET/ROXICET   Take 1-2 tablets by mouth every 4 (four) hours as needed for pain.      pioglitazone 15 MG tablet   Commonly known as: ACTOS   Take 15 mg by mouth daily.      ranitidine 150 MG tablet   Commonly known as: ZANTAC   Take 150 mg by mouth 2 (two) times daily.         Signed: Hewitt Shorts, MD 08/03/2012, 7:57 AM

## 2012-08-03 NOTE — Progress Notes (Signed)
Pt and wife given D/C instructions with Rx's. Both Pt and wife verbalized understanding of teaching. Pt D/C'd home with wife via wheelchair @ [phone] per MD order. Rema Fendt, RN

## 2013-12-03 ENCOUNTER — Other Ambulatory Visit: Payer: Self-pay | Admitting: Internal Medicine

## 2013-12-03 DIAGNOSIS — N183 Chronic kidney disease, stage 3 unspecified: Secondary | ICD-10-CM

## 2013-12-06 ENCOUNTER — Ambulatory Visit
Admission: RE | Admit: 2013-12-06 | Discharge: 2013-12-06 | Disposition: A | Discharge status: Home / Self Care | Payer: Medicare Other | Source: Ambulatory Visit | Attending: Internal Medicine | Admitting: Internal Medicine

## 2013-12-06 DIAGNOSIS — N183 Chronic kidney disease, stage 3 unspecified: Secondary | ICD-10-CM

## 2014-11-12 ENCOUNTER — Other Ambulatory Visit: Payer: Self-pay | Admitting: Gastroenterology

## 2014-11-13 ENCOUNTER — Encounter (HOSPITAL_COMMUNITY): Payer: Self-pay | Admitting: *Deleted

## 2014-11-18 ENCOUNTER — Ambulatory Visit (HOSPITAL_COMMUNITY): Payer: Medicare Other | Admitting: Anesthesiology

## 2014-11-18 ENCOUNTER — Encounter (HOSPITAL_COMMUNITY)
Admission: RE | Disposition: A | Discharge status: Home / Self Care | Payer: Self-pay | Source: Ambulatory Visit | Attending: Gastroenterology

## 2014-11-18 ENCOUNTER — Encounter (HOSPITAL_COMMUNITY): Payer: Self-pay | Admitting: Gastroenterology

## 2014-11-18 ENCOUNTER — Ambulatory Visit (HOSPITAL_COMMUNITY)
Admission: RE | Admit: 2014-11-18 | Discharge: 2014-11-18 | Disposition: A | Discharge status: Home / Self Care | Payer: Medicare Other | Source: Ambulatory Visit | Attending: Gastroenterology | Admitting: Gastroenterology

## 2014-11-18 DIAGNOSIS — I10 Essential (primary) hypertension: Secondary | ICD-10-CM | POA: Diagnosis not present

## 2014-11-18 DIAGNOSIS — Z87442 Personal history of urinary calculi: Secondary | ICD-10-CM | POA: Diagnosis not present

## 2014-11-18 DIAGNOSIS — K219 Gastro-esophageal reflux disease without esophagitis: Secondary | ICD-10-CM | POA: Diagnosis not present

## 2014-11-18 DIAGNOSIS — Z1211 Encounter for screening for malignant neoplasm of colon: Secondary | ICD-10-CM | POA: Insufficient documentation

## 2014-11-18 DIAGNOSIS — E11319 Type 2 diabetes mellitus with unspecified diabetic retinopathy without macular edema: Secondary | ICD-10-CM | POA: Diagnosis not present

## 2014-11-18 HISTORY — PX: COLONOSCOPY WITH PROPOFOL: SHX5780

## 2014-11-18 SURGERY — COLONOSCOPY WITH PROPOFOL
Anesthesia: Monitor Anesthesia Care

## 2014-11-18 MED ORDER — PROPOFOL 10 MG/ML IV BOLUS
INTRAVENOUS | Status: AC
  Filled 2014-11-18: qty 20

## 2014-11-18 MED ORDER — SODIUM CHLORIDE 0.9 % IV SOLN: INTRAVENOUS | Status: DC

## 2014-11-18 MED ORDER — LACTATED RINGERS IV SOLN
INTRAVENOUS | Status: DC
  Administered 2014-11-18: 07:00:00 via INTRAVENOUS

## 2014-11-18 MED ORDER — PROPOFOL INFUSION 10 MG/ML OPTIME
INTRAVENOUS | Status: DC | PRN
  Administered 2014-11-18: 200 ug/kg/min via INTRAVENOUS

## 2014-11-18 SURGICAL SUPPLY — 22 items

## 2014-11-18 NOTE — Anesthesia Postprocedure Evaluation (Signed)
  Anesthesia Post-op Note  Patient: Gabriel PersiaGeorge Howard Taylor  Procedure(s) Performed: Procedure(s): COLONOSCOPY WITH PROPOFOL (N/A)  Patient Location: PACU  Anesthesia Type:MAC  Level of Consciousness: awake, alert , oriented and patient cooperative  Airway and Oxygen Therapy: Patient Spontanous Breathing  Post-op Pain: none  Post-op Assessment: Post-op Vital signs reviewed, Patient's Cardiovascular Status Stable, Respiratory Function Stable, Patent Airway, No signs of Nausea or vomiting and Pain level controlled  Post-op Vital Signs: stable  Last Vitals:  Filed Vitals:   11/18/14 0747  BP:   Pulse:   Temp: 36.6 C  Resp:     Complications: No apparent anesthesia complications

## 2014-11-18 NOTE — Transfer of Care (Signed)
Immediate Anesthesia Transfer of Care Note  Patient: Gabriel PersiaGeorge Howard Taylor  Procedure(s) Performed: Procedure(s): COLONOSCOPY WITH PROPOFOL (N/A)  Patient Location: PACU  Anesthesia Type:MAC  Level of Consciousness: sedated  Airway & Oxygen Therapy: Patient Spontanous Breathing and Patient connected to nasal cannula oxygen  Post-op Assessment: Report given to RN and Post -op Vital signs reviewed and stable  Post vital signs: Reviewed and stable  Last Vitals:  Filed Vitals:   11/18/14 0658  BP: 193/81  Pulse: 94  Temp: 36.6 C  Resp: 28    Complications: No apparent anesthesia complications

## 2014-11-18 NOTE — Anesthesia Preprocedure Evaluation (Signed)
Anesthesia Evaluation  Patient identified by MRN, date of birth, ID band Patient awake    Reviewed: Allergy & Precautions, NPO status , Patient's Chart, lab work & pertinent test results  Airway        Dental   Pulmonary          Cardiovascular hypertension,     Neuro/Psych    GI/Hepatic   Endo/Other  diabetes, Type 2, Oral Hypoglycemic Agents  Renal/GU Renal disease     Musculoskeletal   Abdominal   Peds  Hematology   Anesthesia Other Findings   Reproductive/Obstetrics                             Anesthesia Physical Anesthesia Plan  ASA: III  Anesthesia Plan: MAC   Post-op Pain Management:    Induction: Intravenous  Airway Management Planned: Simple Face Mask  Additional Equipment:   Intra-op Plan:   Post-operative Plan:   Informed Consent: I have reviewed the patients History and Physical, chart, labs and discussed the procedure including the risks, benefits and alternatives for the proposed anesthesia with the patient or authorized representative who has indicated his/her understanding and acceptance.     Plan Discussed with: CRNA, Anesthesiologist and Surgeon  Anesthesia Plan Comments:         Anesthesia Quick Evaluation

## 2014-11-18 NOTE — H&P (Signed)
  Procedure: Screening colonoscopy. Normal screening colonoscopy performed on 11/24/2004  History: The patient is a 74 year old male born 06/08/1941. He is scheduled to undergo a repeat screening colonoscopy with polypectomy to prevent colon cancer.  Past medical history: Type 2 diabetes mellitus diagnosed in 2002. Diabetic retinopathy. Hypertension. Kidney stones. Gastroesophageal reflux. Lithotripsy. Bilateral rotator cuff repair surgeries. Lumbar fusion surgery for degenerative disc disease.  Medication allergies: Biaxin caused mouth ulcers. Codeine.  Exam: The patient is alert and lying comfortably on the endoscopy stretcher. Abdomen is soft and nontender to palpation. Cardiac exam reveals a regular rhythm. Lungs are clear to auscultation.  Plan: Proceed with screening colonoscopy

## 2014-11-18 NOTE — Op Note (Signed)
Procedure: Screening colonoscopy. Normal screening colonoscopy performed on 11/24/2004  Endoscopist: Danise EdgeMartin Johnson  Premedication: Propofol administered by anesthesia  Procedure: The patient was placed in the left lateral decubitus position. Anal inspection and digital rectal exam were normal. The Pentax pediatric colonoscope was introduced into the rectum and advanced to the cecum. A normal-appearing appendiceal orifice was identified. A normal-appearing ileocecal valve was intubated and the terminal ileum inspected. Colonic preparation for the exam today was good. Withdrawal time was 13 minutes  Rectum. Normal. Retroflexed view of the distal rectum normal  Sigmoid colon and descending colon. Normal  Splenic flexure. Normal  Transverse colon. Normal  Hepatic flexure. Normal  Ascending colon. Normal  Cecum and ileocecal valve. Normal  Terminal ileum. Normal  Assessment: Normal screening colonoscopy.

## 2014-11-19 ENCOUNTER — Encounter (HOSPITAL_COMMUNITY): Payer: Self-pay | Admitting: Gastroenterology

## 2014-11-19 LAB — GLUCOSE, CAPILLARY: Glucose-Capillary: 132 mg/dL — ABNORMAL HIGH (ref 70–99)

## 2014-12-31 ENCOUNTER — Ambulatory Visit
Admission: RE | Admit: 2014-12-31 | Discharge: 2014-12-31 | Disposition: A | Discharge status: Home / Self Care | Payer: Medicare Other | Source: Ambulatory Visit | Attending: Internal Medicine | Admitting: Internal Medicine

## 2014-12-31 ENCOUNTER — Other Ambulatory Visit: Payer: Self-pay | Admitting: Internal Medicine

## 2014-12-31 DIAGNOSIS — M79652 Pain in left thigh: Secondary | ICD-10-CM

## 2014-12-31 DIAGNOSIS — M79605 Pain in left leg: Secondary | ICD-10-CM

## 2015-04-22 IMAGING — CR DG HIP (WITH OR WITHOUT PELVIS) 2-3V*L*
1 series · 1 of 1 positions shown · non-contrast
Comparison: None.

CLINICAL DATA: Left hip pain for 4 days, no known injury, initial
encounter

EXAM:
LEFT HIP (WITH PELVIS) 2-3 VIEWS

[t hip frog leg left]
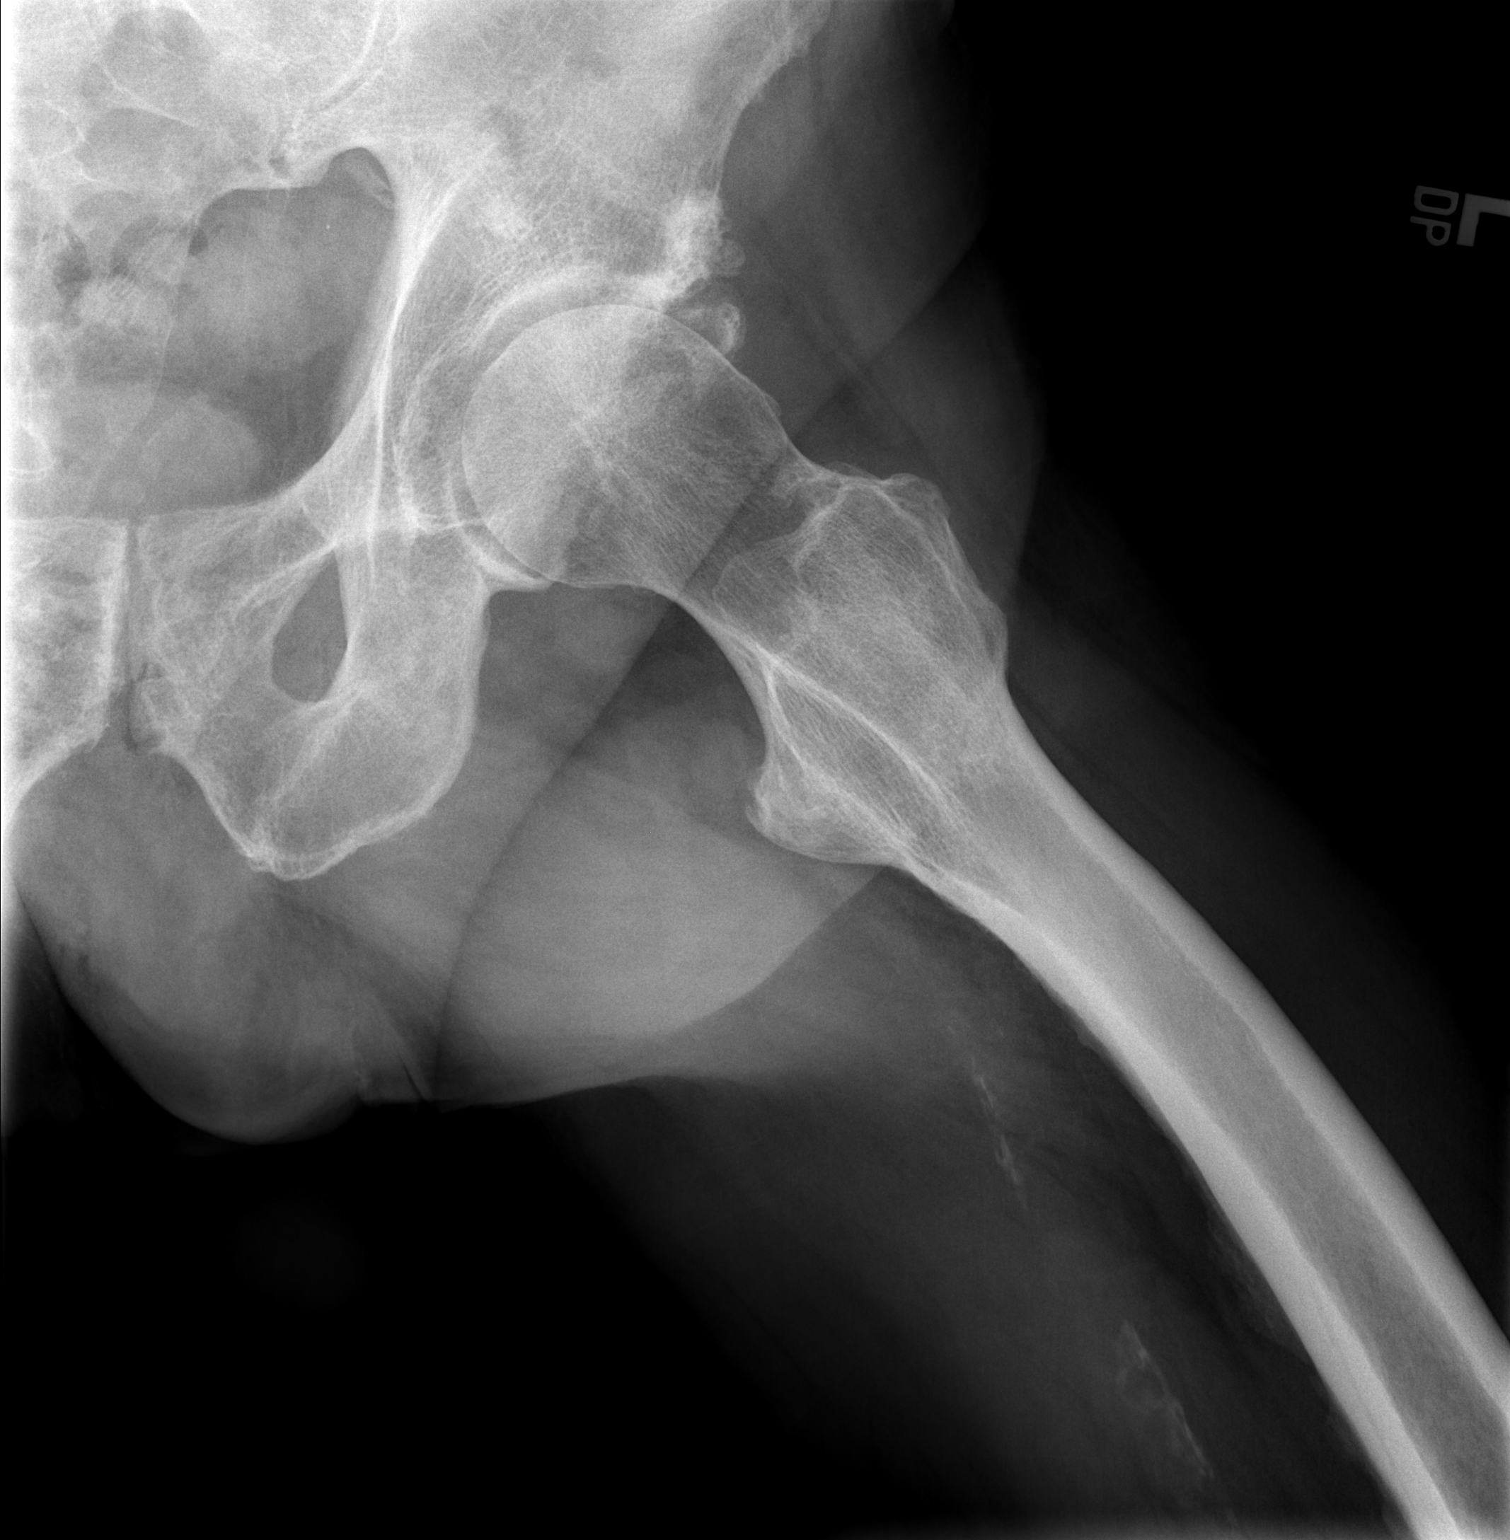

[1 of 1 positions shown; findings below may reference images not displayed]

FINDINGS: The pelvic ring is intact. Diffuse degenerative changes of hip
joints are noted left greater than right. Postsurgical changes are
noted in the lumbar spine. No acute fracture or dislocation is seen.
No soft tissue abnormality is noted.
IMPRESSION: Degenerative changes without acute abnormality.

## 2015-10-11 ENCOUNTER — Inpatient Hospital Stay (HOSPITAL_COMMUNITY)
Admission: EM | Admit: 2015-10-11 | Discharge: 2015-10-13 | DRG: 872 | Disposition: A | Discharge status: Home / Self Care | Payer: Medicare Other | Attending: Internal Medicine | Admitting: Internal Medicine

## 2015-10-11 ENCOUNTER — Emergency Department (HOSPITAL_COMMUNITY): Payer: Medicare Other

## 2015-10-11 ENCOUNTER — Encounter (HOSPITAL_COMMUNITY): Payer: Self-pay | Admitting: *Deleted

## 2015-10-11 DIAGNOSIS — D72829 Elevated white blood cell count, unspecified: Secondary | ICD-10-CM | POA: Diagnosis present

## 2015-10-11 DIAGNOSIS — I10 Essential (primary) hypertension: Secondary | ICD-10-CM | POA: Diagnosis present

## 2015-10-11 DIAGNOSIS — E785 Hyperlipidemia, unspecified: Secondary | ICD-10-CM | POA: Diagnosis present

## 2015-10-11 DIAGNOSIS — A419 Sepsis, unspecified organism: Principal | ICD-10-CM | POA: Diagnosis present

## 2015-10-11 DIAGNOSIS — Z79899 Other long term (current) drug therapy: Secondary | ICD-10-CM | POA: Diagnosis not present

## 2015-10-11 DIAGNOSIS — Z7984 Long term (current) use of oral hypoglycemic drugs: Secondary | ICD-10-CM | POA: Diagnosis not present

## 2015-10-11 DIAGNOSIS — K219 Gastro-esophageal reflux disease without esophagitis: Secondary | ICD-10-CM | POA: Diagnosis present

## 2015-10-11 DIAGNOSIS — E119 Type 2 diabetes mellitus without complications: Secondary | ICD-10-CM | POA: Diagnosis present

## 2015-10-11 DIAGNOSIS — N390 Urinary tract infection, site not specified: Secondary | ICD-10-CM | POA: Diagnosis present

## 2015-10-11 DIAGNOSIS — Z7982 Long term (current) use of aspirin: Secondary | ICD-10-CM | POA: Diagnosis not present

## 2015-10-11 DIAGNOSIS — B961 Klebsiella pneumoniae [K. pneumoniae] as the cause of diseases classified elsewhere: Secondary | ICD-10-CM | POA: Diagnosis present

## 2015-10-11 DIAGNOSIS — R339 Retention of urine, unspecified: Secondary | ICD-10-CM | POA: Diagnosis not present

## 2015-10-11 DIAGNOSIS — R0682 Tachypnea, not elsewhere classified: Secondary | ICD-10-CM | POA: Diagnosis present

## 2015-10-11 DIAGNOSIS — E876 Hypokalemia: Secondary | ICD-10-CM | POA: Diagnosis present

## 2015-10-11 DIAGNOSIS — N179 Acute kidney failure, unspecified: Secondary | ICD-10-CM | POA: Diagnosis present

## 2015-10-11 DIAGNOSIS — Z8249 Family history of ischemic heart disease and other diseases of the circulatory system: Secondary | ICD-10-CM

## 2015-10-11 LAB — BASIC METABOLIC PANEL
Anion gap: 13 (ref 5–15)
BUN: 18 mg/dL (ref 6–20)
CO2: 25 mmol/L (ref 22–32)
Calcium: 9.4 mg/dL (ref 8.9–10.3)
Chloride: 98 mmol/L — ABNORMAL LOW (ref 101–111)
Creatinine, Ser: 1.35 mg/dL — ABNORMAL HIGH (ref 0.61–1.24)
GFR calc Af Amer: 59 mL/min — ABNORMAL LOW (ref >60)
GFR calc non Af Amer: 50 mL/min — ABNORMAL LOW (ref >60)
Glucose, Bld: 312 mg/dL — ABNORMAL HIGH (ref 65–99)
Potassium: 3.4 mmol/L — ABNORMAL LOW (ref 3.5–5.1)
Sodium: 136 mmol/L (ref 135–145)

## 2015-10-11 LAB — URINALYSIS, ROUTINE W REFLEX MICROSCOPIC
Bilirubin Urine: NEGATIVE
Glucose, UA: >1000 mg/dL — AB
Ketones, ur: NEGATIVE mg/dL
Nitrite: POSITIVE — AB
Protein, ur: 30 mg/dL — AB
Specific Gravity, Urine: 1.024 (ref 1.005–1.030)
pH: 5.5 (ref 5.0–8.0)

## 2015-10-11 LAB — LACTIC ACID, PLASMA
Lactic Acid, Venous: 2.2 mmol/L (ref 0.5–2.0)
Lactic Acid, Venous: 2 mmol/L (ref 0.5–2.0)

## 2015-10-11 LAB — CBC
HCT: 42.6 % (ref 39.0–52.0)
Hemoglobin: 14.9 g/dL (ref 13.0–17.0)
MCH: 31.2 pg (ref 26.0–34.0)
MCHC: 35 g/dL (ref 30.0–36.0)
MCV: 89.1 fL (ref 78.0–100.0)
Platelets: 191 10*3/uL (ref 150–400)
RBC: 4.78 MIL/uL (ref 4.22–5.81)
RDW: 12.9 % (ref 11.5–15.5)
WBC: 22 10*3/uL — ABNORMAL HIGH (ref 4.0–10.5)

## 2015-10-11 LAB — PROCALCITONIN: Procalcitonin: 0.57 ng/mL

## 2015-10-11 LAB — URINE MICROSCOPIC-ADD ON: Squamous Epithelial / LPF: NONE SEEN

## 2015-10-11 LAB — TSH: TSH: 1.481 u[IU]/mL (ref 0.350–4.500)

## 2015-10-11 MED ORDER — GLIMEPIRIDE 4 MG PO TABS
4.0000 mg | ORAL_TABLET | Freq: Every day | ORAL | Status: DC
  Administered 2015-10-12 – 2015-10-13 (×2): 4 mg via ORAL
  Filled 2015-10-11 (×3): qty 1

## 2015-10-11 MED ORDER — ACETAMINOPHEN 650 MG RE SUPP: 650.0000 mg | Freq: Four times a day (QID) | RECTAL | Status: DC | PRN

## 2015-10-11 MED ORDER — ACETAMINOPHEN 325 MG PO TABS
650.0000 mg | ORAL_TABLET | Freq: Four times a day (QID) | ORAL | Status: DC | PRN
  Administered 2015-10-11 – 2015-10-12 (×2): 650 mg via ORAL
  Filled 2015-10-11 (×2): qty 2

## 2015-10-11 MED ORDER — ONDANSETRON HCL 4 MG PO TABS
4.0000 mg | ORAL_TABLET | Freq: Four times a day (QID) | ORAL | Status: DC | PRN
  Administered 2015-10-12: 4 mg via ORAL
  Filled 2015-10-11: qty 1

## 2015-10-11 MED ORDER — ONDANSETRON HCL 4 MG/2ML IJ SOLN
4.0000 mg | Freq: Four times a day (QID) | INTRAMUSCULAR | Status: DC | PRN
  Administered 2015-10-12: 4 mg via INTRAVENOUS
  Filled 2015-10-11: qty 2

## 2015-10-11 MED ORDER — OMEGA-3 FATTY ACIDS 1000 MG PO CAPS: 1.0000 g | ORAL_CAPSULE | Freq: Every day | ORAL | Status: DC

## 2015-10-11 MED ORDER — PIOGLITAZONE HCL 15 MG PO TABS
15.0000 mg | ORAL_TABLET | Freq: Every day | ORAL | Status: DC
  Administered 2015-10-12 – 2015-10-13 (×2): 15 mg via ORAL
  Filled 2015-10-11 (×2): qty 1

## 2015-10-11 MED ORDER — ADULT MULTIVITAMIN W/MINERALS CH
1.0000 | ORAL_TABLET | Freq: Every day | ORAL | Status: DC
  Administered 2015-10-12 – 2015-10-13 (×2): 1 via ORAL
  Filled 2015-10-11 (×2): qty 1

## 2015-10-11 MED ORDER — ASPIRIN EC 81 MG PO TBEC
81.0000 mg | DELAYED_RELEASE_TABLET | Freq: Every day | ORAL | Status: DC
  Administered 2015-10-12 – 2015-10-13 (×2): 81 mg via ORAL
  Filled 2015-10-11 (×2): qty 1

## 2015-10-11 MED ORDER — METFORMIN HCL 500 MG PO TABS: 500.0000 mg | ORAL_TABLET | Freq: Two times a day (BID) | ORAL | Status: DC

## 2015-10-11 MED ORDER — DEXTROSE 5 % IV SOLN: 1.0000 g | INTRAVENOUS | Status: DC

## 2015-10-11 MED ORDER — AMLODIPINE BESYLATE 10 MG PO TABS
10.0000 mg | ORAL_TABLET | Freq: Every morning | ORAL | Status: DC
  Administered 2015-10-12 – 2015-10-13 (×2): 10 mg via ORAL
  Filled 2015-10-11 (×2): qty 1

## 2015-10-11 MED ORDER — SODIUM CHLORIDE 0.9 % IV SOLN
INTRAVENOUS | Status: DC
  Administered 2015-10-11 – 2015-10-13 (×4): via INTRAVENOUS

## 2015-10-11 MED ORDER — SODIUM CHLORIDE 0.9 % IJ SOLN: 3.0000 mL | Freq: Two times a day (BID) | INTRAMUSCULAR | Status: DC

## 2015-10-11 MED ORDER — DEXTROSE 5 % IV SOLN
1.0000 g | INTRAVENOUS | Status: DC
  Administered 2015-10-12: 1 g via INTRAVENOUS
  Filled 2015-10-11 (×2): qty 10

## 2015-10-11 MED ORDER — ONDANSETRON HCL 4 MG/2ML IJ SOLN
4.0000 mg | Freq: Once | INTRAMUSCULAR | Status: AC
Start: 2015-10-11 — End: 2015-10-11
  Administered 2015-10-11: 4 mg via INTRAVENOUS
  Filled 2015-10-11: qty 2

## 2015-10-11 MED ORDER — POTASSIUM CHLORIDE CRYS ER 20 MEQ PO TBCR
40.0000 meq | EXTENDED_RELEASE_TABLET | Freq: Once | ORAL | Status: AC
  Administered 2015-10-11: 40 meq via ORAL
  Filled 2015-10-11: qty 2

## 2015-10-11 MED ORDER — CEFTRIAXONE SODIUM 1 G IJ SOLR
1.0000 g | Freq: Once | INTRAMUSCULAR | Status: AC
  Administered 2015-10-11: 1 g via INTRAVENOUS
  Filled 2015-10-11: qty 10

## 2015-10-11 MED ORDER — SODIUM CHLORIDE 0.9 % IV BOLUS (SEPSIS)
1000.0000 mL | Freq: Once | INTRAVENOUS | Status: AC
  Administered 2015-10-11: 1000 mL via INTRAVENOUS

## 2015-10-11 MED ORDER — OMEGA-3-ACID ETHYL ESTERS 1 G PO CAPS
1.0000 g | ORAL_CAPSULE | Freq: Every day | ORAL | Status: DC
  Administered 2015-10-12 – 2015-10-13 (×2): 1 g via ORAL
  Filled 2015-10-11 (×2): qty 1

## 2015-10-11 MED ORDER — GUAIFENESIN-DM 100-10 MG/5ML PO SYRP
5.0000 mL | ORAL_SOLUTION | ORAL | Status: DC | PRN
  Administered 2015-10-11 – 2015-10-12 (×3): 5 mL via ORAL
  Filled 2015-10-11 (×3): qty 10

## 2015-10-11 NOTE — ED Notes (Signed)
Called to give 20 minute timer.

## 2015-10-11 NOTE — ED Provider Notes (Addendum)
CSN: [id number]     Arrival date & time 10/11/15  1045 History   First MD Initiated Contact with Patient 10/11/15 1100     Chief Complaint  Patient presents with  . Urinary Retention  . Back Pain      Patient is a 75 y.o. male presenting with back pain. The history is provided by the patient.  Back Pain Associated symptoms: dysuria   Associated symptoms: no abdominal pain, no chest pain, no fever, no headaches and no numbness   patient has had low back pain for the last few days. Also has the feeling that he has to urinate and he is having difficulty going. He has had some chills. No nausea or vomiting. No diarrhea. He states that he feels as if he still has to urinate after he goes.  He also has a cough. Minimal production. No sore throat.   Past Medical History  Diagnosis Date  . Hypertension   . Diabetes mellitus without complication (HCC)     oral medications  . Kidney stones     hx of  . GERD (gastroesophageal reflux disease)    Past Surgical History  Procedure Laterality Date  . Rotator cuff repair      bilateral shoulder  . Throat surgery      benign tumor removed  . Back surgery    . Colonoscopy with propofol N/A 11/18/2014    Procedure: COLONOSCOPY WITH PROPOFOL;  Surgeon: Charolett Bumpers, MD;  Location: [facility];  Service: Endoscopy;  Laterality: N/A;   No family history on file. Social History  Substance Use Topics  . Smoking status: Never Smoker   . Smokeless tobacco: None  . Alcohol Use: No    Review of Systems  Constitutional: Positive for fatigue. Negative for fever and appetite change.  Respiratory: Positive for cough. Negative for shortness of breath.   Cardiovascular: Negative for chest pain.  Gastrointestinal: Negative for abdominal pain and anal bleeding.  Genitourinary: Positive for dysuria, frequency and difficulty urinating.  Musculoskeletal: Positive for back pain.  Skin: Negative for rash.  Neurological: Negative for numbness and  headaches.      Allergies  Review of patient's allergies indicates no known allergies.  Home Medications   Prior to Admission medications   Medication Sig Start Date End Date Taking? Authorizing Provider  amLODipine (NORVASC) 10 MG tablet Take 10 mg by mouth every morning.   Yes Historical Provider, MD  aspirin EC 81 MG tablet Take 81 mg by mouth daily.   Yes Historical Provider, MD  fish oil-omega-3 fatty acids 1000 MG capsule Take 1 g by mouth daily.   Yes Historical Provider, MD  glimepiride (AMARYL) 4 MG tablet Take 4 mg by mouth daily.   Yes Historical Provider, MD  losartan-hydrochlorothiazide (HYZAAR) 50-12.5 MG tablet Take 1 tablet by mouth daily. 10/09/15  Yes Historical Provider, MD  metFORMIN (GLUCOPHAGE) 500 MG tablet Take 500 mg by mouth 2 (two) times daily with a meal.   Yes Historical Provider, MD  Multiple Vitamin (MULTIVITAMIN WITH MINERALS) TABS Take 1 tablet by mouth daily.   Yes Historical Provider, MD  pioglitazone (ACTOS) 15 MG tablet Take 15 mg by mouth daily.   Yes Historical Provider, MD  cyclobenzaprine (FLEXERIL) 10 MG tablet Take 1 tablet (10 mg total) by mouth 3 (three) times daily as needed for muscle spasms. Patient not taking: Reported on 11/13/2014 08/03/12   Shirlean Kelly, MD  oxyCODONE-acetaminophen (PERCOCET/ROXICET) 5-325 MG per tablet Take 1-2 tablets by mouth  every 4 (four) hours as needed for pain. Patient not taking: Reported on 11/13/2014 08/03/12   Shirlean Kelly, MD   BP 166/56 mmHg  Pulse 102  Temp(Src) 99 F (37.2 C) (Oral)  Resp 20  Ht  (1.854 m)  Wt 178 lb 6.4 oz (80.922 kg)  BMI 23.54 kg/m2  SpO2 96% Physical Exam  Constitutional: He appears well-developed.  HENT:  Head: Normocephalic.  Neck: Neck supple.  Cardiovascular: Normal rate.   Pulmonary/Chest: Effort normal.  Mildly harsh breath sounds  Abdominal: Soft.  Mild suprapubic tenderness without rebound or guarding.  Musculoskeletal: Normal range of motion.   Neurological: He is alert.  Skin: Skin is warm.    ED Course  Procedures (including critical care time) Labs Review Labs Reviewed  URINALYSIS, ROUTINE W REFLEX MICROSCOPIC (NOT AT [facility]) - Abnormal; Notable for the following:    APPearance CLOUDY (*)    Glucose, UA >1000 (*)    Hgb urine dipstick MODERATE (*)    Protein, ur 30 (*)    Nitrite POSITIVE (*)    Leukocytes, UA SMALL (*)    All other components within normal limits  BASIC METABOLIC PANEL - Abnormal; Notable for the following:    Potassium 3.4 (*)    Chloride 98 (*)    Glucose, Bld 312 (*)    Creatinine, Ser 1.35 (*)    GFR calc non Af Amer 50 (*)    GFR calc Af Amer 59 (*)    All other components within normal limits  CBC - Abnormal; Notable for the following:    WBC 22.0 (*)    All other components within normal limits  URINE MICROSCOPIC-ADD ON - Abnormal; Notable for the following:    Bacteria, UA MANY (*)    All other components within normal limits  URINE CULTURE  URINE CULTURE  TSH    Imaging Review Dg Chest 2 View  10/11/2015  CLINICAL DATA:  Cough, shortness of breath and weakness for 3 days EXAM: CHEST  2 VIEW COMPARISON:  07/24/2012 FINDINGS: Cardiomediastinal silhouette is normal. Mediastinal contours appear intact. There is no evidence of focal airspace consolidation, pleural effusion or pneumothorax. Osseous structures are without acute abnormality. 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 Soft tissues are grossly normal. IMPRESSION: No active cardiopulmonary disease. Electronically Signed   By: Ted Mcalpine M.D.   On: 10/11/2015 12:05   I have personally reviewed and evaluated these images and lab results as part of my medical decision-making.   EKG Interpretation None      MDM   Final diagnoses:  Lower urinary tract infection, acute    Patient presents with dysuria. Found a fever. Likely either UTI or prostatitis. White count is elevated. Will  admit to internal medicine. Also has had a cough but x-ray negative for pneumonia.    Benjiman Core, MD 10/11/15 1600  Benjiman Core, MD 11/05/15 [phone]

## 2015-10-11 NOTE — H&P (Signed)
[facility] History and Physical  Schneider Warchol ZOX:[id number] DOB: 03/20/1941 DOA: 10/11/2015  Referring physician: ER physician: Dr. Benjiman Core  PCP: Lillia Mountain, MD  Chief Complaint: back pain,urinary retention   HPI:  75 year-old male with past medical history of diabetes on by mouth and I hyperglycemics, hypertension, dyslipidemia who presented to [facility] with reports of back pain and burning sensation on urination started few days prior to the admission. No reports of fevers but he had some chills. No reports of blood in the urine. No reports of urinary frequency or urgency but he does feel that it is more difficult to urinate. No reports of abdominal pain. No nausea or vomiting. No diarrhea or constipation. No blood in the stool. No chest pain or shortness of breath or palpitations. No cough. No lightheadedness or loss of consciousness.  In ED, blood pressure was 146/64, heart rate 119, respiratory rate 20, Tmax 101.7 F, oxygen saturation 95% on room air. Blood work was notable for leukocytosis of 22, potassium 3.4 and creatinine of 1.35 (baseline is normal creatinine). Urinalysis on the admission showed small leukocytes, positive nitrites and many bacteria with too numerous to count white blood cells. Patient was started on empiric Rocephin and admitted for further management of UTI  Assessment & Plan    Principal Problem: Sepsis secondary to UTI (HCC) / Leukocytosis - Sepsis criteria on the admission with fever, tachycardia, tachypnea, leukocytosis. Source of infection presumed to be UTI. UA on the admission showed small leukocytes, positive nitrates, many bacteria and too numerous to count white blood cells - Sepsis workup initiated - Follow-up pro calcitonin level, lactic acid and blood cultures - Follow up urine culture results - Started on empiric Rocephin - Monitor on telemetry  Active Problems:   Benign essential HTN - Resume  Norvasc    ARF (acute renal failure) (HCC) - Likely in the setting of sepsis and UTI versus medications losartan-HCTZ and metformin - Placed metformin and losartan-HCTZ on hold - Continue IV fluids - Follow-up BMP in the morning    Hypokalemia - Likely due to sepsis - Supplemented - Follow-up BMP in the morning    Controlled type 2 diabetes mellitus without complication, without long-term current use of insulin (HCC) - Continue Amaryl, Actos - Hold metformin due to renal insufficiency  DVT prophylaxis:  - SCDs bilaterally  Radiological Exams on Admission: Dg Chest 2 View 10/11/2015   No active cardiopulmonary disease. Electronically Signed   By: Ted Mcalpine M.D.   On: 10/11/2015 12:05    Code Status: Full Family Communication: Plan of care discussed with the patient  Disposition Plan: Admit for further evaluation, telemetry floor due to tachycardia   Manson Passey, MD  [facility] Pager [phone]  Time spent in minutes: 75 minutes  Review of Systems:  Constitutional: Negative for fever, chills and malaise/fatigue. Negative for diaphoresis.  HENT: Negative for hearing loss, ear pain, nosebleeds, congestion, sore throat, neck pain, tinnitus and ear discharge.   Eyes: Negative for blurred vision, double vision, photophobia, pain, discharge and redness.  Respiratory: Negative for cough, hemoptysis, sputum production, shortness of breath, wheezing and stridor.   Cardiovascular: Negative for chest pain, palpitations, orthopnea, claudication and leg swelling.  Gastrointestinal: Negative for nausea, vomiting and abdominal pain. Negative for heartburn, constipation, blood in stool and melena.  Genitourinary: Negative for dysuria, urgency, frequency, hematuria and flank pain.  Musculoskeletal: Negative for myalgias, positive for back pain,no joint pain and falls.  Skin: Negative for itching and  rash.  Neurological: Negative for dizziness and weakness. Negative for  tingling, tremors, sensory change, speech change, focal weakness, loss of consciousness and headaches.  Endo/Heme/Allergies: Negative for environmental allergies and polydipsia. Does not bruise/bleed easily.  Psychiatric/Behavioral: Negative for suicidal ideas. The patient is not nervous/anxious.      History reviewed. No pertinent past medical history. Past Surgical History  Procedure Laterality Date  . Rotator cuff repair      bilateral shoulder  . Throat surgery      benign tumor removed  . Back surgery    . Colonoscopy with propofol N/A 11/18/2014    Procedure: COLONOSCOPY WITH PROPOFOL;  Surgeon: Charolett Bumpers, MD;  Location: [facility];  Service: Endoscopy;  Laterality: N/A;   Social History:  reports that he has never smoked. He does not have any smokeless tobacco history on file. He reports that he does not drink alcohol or use illicit drugs.  No Known Allergies  Family History: hypertension in mother    Prior to Admission medications   Medication Sig Start Date End Date Taking? Authorizing Provider  amLODipine (NORVASC) 10 MG tablet Take 10 mg by mouth every morning.   Yes Historical Provider, MD  aspirin EC 81 MG tablet Take 81 mg by mouth daily.   Yes Historical Provider, MD  fish oil-omega-3 fatty acids 1000 MG capsule Take 1 g by mouth daily.   Yes Historical Provider, MD  glimepiride (AMARYL) 4 MG tablet Take 4 mg by mouth daily.   Yes Historical Provider, MD  losartan-hydrochlorothiazide (HYZAAR) 50-12.5 MG tablet Take 1 tablet by mouth daily. 10/09/15  Yes Historical Provider, MD  metFORMIN (GLUCOPHAGE) 500 MG tablet Take 500 mg by mouth 2 (two) times daily with a meal.   Yes Historical Provider, MD  Multiple Vitamin (MULTIVITAMIN WITH MINERALS) TABS Take 1 tablet by mouth daily.   Yes Historical Provider, MD  pioglitazone (ACTOS) 15 MG tablet Take 15 mg by mouth daily.   Yes Historical Provider, MD  cyclobenzaprine (FLEXERIL) 10 MG tablet Take 1 tablet (10 mg  total) by mouth 3 (three) times daily as needed for muscle spasms. Patient not taking: Reported on 11/13/2014 08/03/12   Shirlean Kelly, MD  oxyCODONE-acetaminophen (PERCOCET/ROXICET) 5-325 MG per tablet Take 1-2 tablets by mouth every 4 (four) hours as needed for pain. Patient not taking: Reported on 11/13/2014 08/03/12   Shirlean Kelly, MD   Physical Exam: Filed Vitals:   10/11/15 1049 10/11/15 1207 10/11/15 1349 10/11/15 1447  BP: 155/93  146/64 166/56  Pulse: 119  99 102  Temp: 99.7 F (37.6 C) 101.7 F (38.7 C)  99 F (37.2 C)  TempSrc: Oral Rectal  Oral  Resp: Height:     (1.854 m)  Weight:    178 lb 6.4 oz (80.922 kg)  SpO2: 97%  95% 96%    Physical Exam  Constitutional: Appears well-developed and well-nourished. No distress.  HENT: Normocephalic. No tonsillar erythema or exudates Eyes: Conjunctivae are normal. No scleral icterus.  Neck: Normal ROM. Neck supple. No JVD. No tracheal deviation. No thyromegaly.  CVS: RRR, S1/S2 appreciated  Pulmonary: Effort and breath sounds normal, no stridor, rhonchi, wheezes, rales.  Abdominal: Soft. BS +,  no distension, tenderness, rebound or guarding.  Musculoskeletal: Normal range of motion. No edema and no tenderness.  Lymphadenopathy: No lymphadenopathy noted, cervical, inguinal. Neuro: Alert. Normal reflexes, muscle tone coordination. No focal neurologic deficits. Skin: Skin is warm and dry. No rash noted.  No  erythema. No pallor.  Psychiatric: Normal mood and affect. Behavior, judgment, thought content normal.   Labs on Admission:  Basic Metabolic Panel:  Recent Labs Lab 10/11/15 1108  NA 136  K 3.4*  CL 98*  CO2 25  GLUCOSE 312*  BUN 18  CREATININE 1.35*  CALCIUM 9.4   Liver Function Tests: No results for input(s): AST, ALT, ALKPHOS, BILITOT, PROT, ALBUMIN in the last 168 hours. No results for input(s): LIPASE, AMYLASE in the last 168 hours. No results for input(s): AMMONIA in the last 168  hours. CBC:  Recent Labs Lab 10/11/15 1108  WBC 22.0*  HGB 14.9  HCT 42.6  MCV 89.1  PLT 191   Cardiac Enzymes: No results for input(s): CKTOTAL, CKMB, CKMBINDEX, TROPONINI in the last 168 hours. BNP: Invalid input(s): POCBNP CBG: No results for input(s): GLUCAP in the last 168 hours.  If 7PM-7AM, please contact night-coverage [email or web address] Password [facility] 10/11/2015, 4:38 PM

## 2015-10-11 NOTE — Plan of Care (Signed)
Problem: Education: Goal: Knowledge of treatment and prevention of UTI/Pyleonephritis will improve Outcome: Progressing Continue to educate pt and family members on diagnosis, treatments and plan of care.    Problem: Urinary Elimination: Goal: Signs and symptoms of infection will decrease BP 155/54 mmHg  Pulse 111  Temp(Src) 100.6 F (38.1 C) (Oral)  Resp 19  Ht  (1.854 m)  Wt 80.922 kg (178 lb 6.4 oz)  BMI 23.54 kg/m2  SpO2 92% WBC 22, lactic acid pending.  Blood cultures pending.  Continue to monitor.

## 2015-10-11 NOTE — Progress Notes (Signed)
CRITICAL VALUE ALERT  Critical value received:  Lactic acid- 2.2  Date of notification:  10/11/15  Time of notification:  1750  Critical value read back:Yes.    Nurse who received alert:  Sumner Boast  MD notified (1st page):  Dr. Elisabeth Pigeon  Time of first page:  1800  MD notified (2nd page):  Time of second page:  Responding MD:  Dr. Elisabeth Pigeon  Time MD responded:  [phone]

## 2015-10-11 NOTE — ED Notes (Signed)
Pt reports low back pain with urinary retention.  Pt also reports low grade fever and urinary incontinence.  Denies any prostate problems at this time.

## 2015-10-12 LAB — CBC
HCT: 37.3 % — ABNORMAL LOW (ref 39.0–52.0)
Hemoglobin: 12.8 g/dL — ABNORMAL LOW (ref 13.0–17.0)
MCH: 30.8 pg (ref 26.0–34.0)
MCHC: 34.3 g/dL (ref 30.0–36.0)
MCV: 89.9 fL (ref 78.0–100.0)
Platelets: 155 10*3/uL (ref 150–400)
RBC: 4.15 MIL/uL — ABNORMAL LOW (ref 4.22–5.81)
RDW: 13 % (ref 11.5–15.5)
WBC: 16.8 10*3/uL — ABNORMAL HIGH (ref 4.0–10.5)

## 2015-10-12 LAB — GLUCOSE, CAPILLARY: Glucose-Capillary: 189 mg/dL — ABNORMAL HIGH (ref 65–99)

## 2015-10-12 LAB — COMPREHENSIVE METABOLIC PANEL
ALT: 15 U/L — ABNORMAL LOW (ref 17–63)
AST: 19 U/L (ref 15–41)
Albumin: 3.5 g/dL (ref 3.5–5.0)
Alkaline Phosphatase: 68 U/L (ref 38–126)
Anion gap: 12 (ref 5–15)
BUN: 15 mg/dL (ref 6–20)
CO2: 26 mmol/L (ref 22–32)
Calcium: 8.7 mg/dL — ABNORMAL LOW (ref 8.9–10.3)
Chloride: 100 mmol/L — ABNORMAL LOW (ref 101–111)
Creatinine, Ser: 1.1 mg/dL (ref 0.61–1.24)
GFR calc Af Amer: >60 mL/min (ref >60)
GFR calc non Af Amer: >60 mL/min (ref >60)
Glucose, Bld: 220 mg/dL — ABNORMAL HIGH (ref 65–99)
Potassium: 3.4 mmol/L — ABNORMAL LOW (ref 3.5–5.1)
Sodium: 138 mmol/L (ref 135–145)
Total Bilirubin: 1.5 mg/dL — ABNORMAL HIGH (ref 0.3–1.2)
Total Protein: 6.6 g/dL (ref 6.5–8.1)

## 2015-10-12 LAB — TROPONIN I: Troponin I: <0.03 ng/mL (ref <0.031)

## 2015-10-12 MED ORDER — PANTOPRAZOLE SODIUM 40 MG PO TBEC
40.0000 mg | DELAYED_RELEASE_TABLET | Freq: Every day | ORAL | Status: DC
  Administered 2015-10-12 – 2015-10-13 (×2): 40 mg via ORAL
  Filled 2015-10-12 (×2): qty 1

## 2015-10-12 MED ORDER — ZOLPIDEM TARTRATE 5 MG PO TABS
5.0000 mg | ORAL_TABLET | Freq: Every evening | ORAL | Status: DC | PRN
  Administered 2015-10-12: 5 mg via ORAL
  Filled 2015-10-12: qty 1

## 2015-10-12 MED ORDER — ALUM & MAG HYDROXIDE-SIMETH 200-200-20 MG/5ML PO SUSP
15.0000 mL | ORAL | Status: DC | PRN
  Administered 2015-10-12 (×2): 15 mL via ORAL
  Filled 2015-10-12 (×2): qty 30

## 2015-10-12 MED ORDER — GI COCKTAIL ~~LOC~~
30.0000 mL | Freq: Once | ORAL | Status: AC
  Administered 2015-10-12: 30 mL via ORAL
  Filled 2015-10-12: qty 30

## 2015-10-12 MED ORDER — GI COCKTAIL ~~LOC~~
30.0000 mL | Freq: Three times a day (TID) | ORAL | Status: DC | PRN
  Administered 2015-10-12: 30 mL via ORAL
  Filled 2015-10-12: qty 30

## 2015-10-12 NOTE — Plan of Care (Signed)
Problem: Education: Goal: Knowledge of treatment and prevention of UTI/Pyleonephritis will improve Outcome: Progressing .  Problem: Urinary Elimination: Goal: Signs and symptoms of infection will decrease Outcome: Progressing WBC's trending down 16.8 today.   K+ remains at 3.4.  Lactic acid 2.0. Afebrile.  VSS.   Voiding adequately.   Post void residual per bladder scan .

## 2015-10-12 NOTE — Progress Notes (Addendum)
[facility] PROGRESS NOTE  [name] AVW:[id number] DOB: [birth year] DOA: 10/11/2015 PCP: Lillia Mountain, MD  HPI/Brief narrative 75 year-old male with past medical history of diabetes on by mouth and I hyperglycemics, hypertension, dyslipidemia who presented to [facility] long hospital with reports of back pain and burning sensation on urination started few days prior to the admission. No reports of fevers but he had some chills. No reports of blood in the urine. No reports of urinary frequency or urgency but he does feel that it is more difficult to urinate. No reports of abdominal pain. No nausea or vomiting. No diarrhea or constipation. No blood in the stool. No chest pain or shortness of breath or palpitations. No cough. No lightheadedness or loss of consciousness  Assessment/Plan: Sepsis secondary to UTI (HCC) / Leukocytosis - Sepsis criteria on the admission with fever, tachycardia, tachypnea, leukocytosis. Source of infection presumed to be UTI. UA on the admission showed small leukocytes, positive nitrates, many bacteria and too numerous to count white blood cells - Sepsis workup initiated - Calcitonin 0.57 - Lactic acid 2.2, improved to 2.0 - Urine cx with >100,000 GNR - Blood cx pending - So far, improving on empiric Rocephin   Benign essential HTN - Resume Norvasc   ARF (acute renal failure) (HCC) - Likely in the setting of sepsis and UTI versus medications losartan-HCTZ and metformin - Placed metformin and losartan-HCTZ on hold - Continue IV fluids - Renal function improving   Hypokalemia - Likely due to sepsis - Supplemented - Follow-up BMP in the morning   Controlled type 2 diabetes mellitus without complication, without long-term current use of insulin (HCC) - Continue Amaryl, Actos - Held metformin due to renal insufficiency  DVT prophylaxis:  - SCDs bilaterally  Epigastric pain - Likely GERD - Given GI cocktail. Start PPI - EKG  unremarkable. Trop pending - Pain is positional and improves on sitting up, described as burning  Code Status: Full Family Communication: Pt in room, family at bedside Disposition Plan: Possible d/c in 24-48hrs   Consultants:    Procedures:    Antibiotics: Anti-infectives    Start     Dose/Rate Route Frequency Ordered Stop   10/12/15 1300  cefTRIAXone (ROCEPHIN) 1 g in dextrose 5 % 50 mL IVPB     1 g 100 mL/hr over 30 Minutes Intravenous Every 24 hours 10/11/15 1637     10/12/15 0000  cefTRIAXone (ROCEPHIN) 1 g in dextrose 5 % 50 mL IVPB  Status:  Discontinued     1 g 100 mL/hr over 30 Minutes Intravenous Every 24 hours 10/11/15 1314 10/11/15 1343   10/11/15 1245  cefTRIAXone (ROCEPHIN) 1 g in dextrose 5 % 50 mL IVPB     1 g 100 mL/hr over 30 Minutes Intravenous  Once 10/11/15 1239 10/11/15 1333      HPI/Subjective: Feels better today  Objective: Filed Vitals:   10/11/15 2243 10/12/15 0653 10/12/15 0909 10/12/15 1210  BP: 144/63 143/61 130/65 152/58  Pulse: 98 94 88 88  Temp: 98.6 F (37 C) 98.2 F (36.8 C)  98 F (36.7 C)  TempSrc: Oral Oral  Oral  Resp: Height:      Weight:  79.9 kg (176 lb 2.4 oz)    SpO2: 97% 95%  98%    Intake/Output Summary (Last 24 hours) at 10/12/15 1426 Last data filed at 10/12/15 1219  Gross per 24 hour  Intake   1555 ml  Output   1266 ml  Net    289 ml   Filed Weights   10/11/15 1447 10/12/15 0653  Weight: 80.922 kg (178 lb 6.4 oz) 79.9 kg (176 lb 2.4 oz)    Exam:   General:  Awake, in nad, laying in bed  Cardiovascular: regular, s1, s2  Respiratory: normal resp effort, no wheezing  Abdomen: soft,nondistended  Musculoskeletal: perfused, no clubbing   Data Reviewed: Basic Metabolic Panel:  Recent Labs Lab 10/11/15 1108 10/12/15 0439  NA 136 138  K 3.4* 3.4*  CL 98* 100*  CO2 25 26  GLUCOSE 312* 220*  BUN 18 15  CREATININE 1.35* 1.10  CALCIUM 9.4 8.7*   Liver Function Tests:  Recent  Labs Lab 10/12/15 0439  AST 19  ALT 15*  ALKPHOS 68  BILITOT 1.5*  PROT 6.6  ALBUMIN 3.5   No results for input(s): LIPASE, AMYLASE in the last 168 hours. No results for input(s): AMMONIA in the last 168 hours. CBC:  Recent Labs Lab 10/11/15 1108 10/12/15 0439  WBC 22.0* 16.8*  HGB 14.9 12.8*  HCT 42.6 37.3*  MCV 89.1 89.9  PLT 191 155   Cardiac Enzymes: No results for input(s): CKTOTAL, CKMB, CKMBINDEX, TROPONINI in the last 168 hours. BNP (last 3 results) No results for input(s): BNP in the last 8760 hours.  ProBNP (last 3 results) No results for input(s): PROBNP in the last 8760 hours.  CBG:  Recent Labs Lab 10/12/15 0728  GLUCAP 189*    Recent Results (from the past 240 hour(s))  Urine C&S     Status: None (Preliminary result)   Collection Time: 10/11/15 11:13 AM  Result Value Ref Range Status   Specimen Description URINE, CLEAN CATCH  Final   Special Requests Normal  Final   Culture   Final    >=100,000 COLONIES/mL GRAM NEGATIVE RODS Performed at [facility]    Report Status PENDING  Incomplete     Studies: Dg Chest 2 View  10/11/2015  CLINICAL DATA:  Cough, shortness of breath and weakness for 3 days EXAM: CHEST  2 VIEW COMPARISON:  07/24/2012 FINDINGS: Cardiomediastinal silhouette is normal. Mediastinal contours appear intact. There is no evidence of focal airspace consolidation, pleural effusion or pneumothorax. Osseous structures are without acute abnormality. 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 Soft tissues are grossly normal. IMPRESSION: No active cardiopulmonary disease. Electronically Signed   By: Ted Mcalpine M.D.   On: 10/11/2015 12:05    Scheduled Meds: . amLODipine  10 mg Oral q morning - 10a  . aspirin EC  81 mg Oral Daily  . cefTRIAXone (ROCEPHIN)  IV  1 g Intravenous Q24H  . glimepiride  4 mg Oral Q breakfast  . multivitamin with minerals  1 tablet Oral Daily  . omega-3 acid  ethyl esters  1 g Oral Daily  . pantoprazole  40 mg Oral Daily  . pioglitazone  15 mg Oral Daily  . sodium chloride  3 mL Intravenous Q12H   Continuous Infusions: . sodium chloride 75 mL/hr at 10/12/15 0132    Principal Problem:   Sepsis secondary to UTI [facility]) Active Problems:   Benign essential HTN   Leukocytosis   ARF (acute renal failure) (HCC)   Hypokalemia   Controlled type 2 diabetes mellitus without complication, without long-term current use of insulin (HCC)    Thadd Apuzzo K  [facility] Pager [phone]. If 7PM-7AM, please contact night-coverage at [email or web address], password [facility] 10/12/2015, 2:26 PM  LOS: 1 day

## 2015-10-13 LAB — CBC
HCT: 34.7 % — ABNORMAL LOW (ref 39.0–52.0)
Hemoglobin: 11.8 g/dL — ABNORMAL LOW (ref 13.0–17.0)
MCH: 31.1 pg (ref 26.0–34.0)
MCHC: 34 g/dL (ref 30.0–36.0)
MCV: 91.3 fL (ref 78.0–100.0)
Platelets: 174 10*3/uL (ref 150–400)
RBC: 3.8 MIL/uL — ABNORMAL LOW (ref 4.22–5.81)
RDW: 12.8 % (ref 11.5–15.5)
WBC: 11.3 10*3/uL — ABNORMAL HIGH (ref 4.0–10.5)

## 2015-10-13 LAB — URINE CULTURE
Culture: >=100000
Special Requests: NORMAL

## 2015-10-13 LAB — BASIC METABOLIC PANEL
Anion gap: 9 (ref 5–15)
BUN: 15 mg/dL (ref 6–20)
CO2: 27 mmol/L (ref 22–32)
Calcium: 8.5 mg/dL — ABNORMAL LOW (ref 8.9–10.3)
Chloride: 104 mmol/L (ref 101–111)
Creatinine, Ser: 1.07 mg/dL (ref 0.61–1.24)
GFR calc Af Amer: >60 mL/min (ref >60)
GFR calc non Af Amer: >60 mL/min (ref >60)
Glucose, Bld: 188 mg/dL — ABNORMAL HIGH (ref 65–99)
Potassium: 3.5 mmol/L (ref 3.5–5.1)
Sodium: 140 mmol/L (ref 135–145)

## 2015-10-13 LAB — GLUCOSE, CAPILLARY: Glucose-Capillary: 186 mg/dL — ABNORMAL HIGH (ref 65–99)

## 2015-10-13 MED ORDER — PANTOPRAZOLE SODIUM 40 MG PO TBEC: 40.0000 mg | DELAYED_RELEASE_TABLET | Freq: Every day | ORAL | Status: DC

## 2015-10-13 MED ORDER — CIPROFLOXACIN HCL 500 MG PO TABS: 500.0000 mg | ORAL_TABLET | Freq: Two times a day (BID) | ORAL | Status: DC

## 2015-10-13 NOTE — Discharge Summary (Signed)
Physician Discharge Summary  [name] [id number]:[id number] DOB: [birth year] DOA: 10/11/2015  PCP: Lillia Mountain, MD  Admit date: 10/11/2015 Discharge date: 10/13/2015  Time spent: 20 minutes  Recommendations for Outpatient Follow-up:  1. Follow up with PCP in 1-2 weeks   Discharge Diagnoses:  Principal Problem:   Sepsis secondary to UTI [facility]) Active Problems:   Benign essential HTN   Leukocytosis   ARF (acute renal failure) (HCC)   Hypokalemia   Controlled type 2 diabetes mellitus without complication, without long-term current use of insulin [facility])   Discharge Condition: Improved  Diet recommendation: Diabetic  Filed Weights   10/11/15 1447 10/12/15 0653 10/13/15 0626  Weight: 80.922 kg (178 lb 6.4 oz) 79.9 kg (176 lb 2.4 oz) 81.738 kg (180 lb 3.2 oz)    History of present illness:  Please review dictated H and P from 1/22 for details. Briefly, 75 year-old male with past medical history of diabetes on by mouth and I hyperglycemics, hypertension, dyslipidemia who presented to [facility] with reports of back pain and burning sensation on urination started few days prior to the admission. No reports of fevers but he had some chills. No reports of blood in the urine. No reports of urinary frequency or urgency but he does feel that it is more difficult to urinate. No reports of abdominal pain. No nausea or vomiting. No diarrhea or constipation. No blood in the stool. No chest pain or shortness of breath or palpitations. No cough. No lightheadedness or loss of consciousness  Hospital Course:  Sepsis secondary to [place] UTI (HCC) / Leukocytosis, sepsis present on admission - Sepsis criteria on the admission with fever, tachycardia, tachypnea, leukocytosis. Source of infection presumed to be UTI. UA on the admission showed small leukocytes, positive nitrates, many bacteria and too numerous to count white blood cells - Sepsis workup initiated - Calcitonin 0.57 -  Lactic acid 2.2, improved to 2.0 - Urine cx with >100,000 Klebsiella species - Blood cx neg x 2 - Patient was continued empirically on rocephin this admission. Plan to complete course of Ciprofloxacin on discharge   Benign essential HTN - Resume Norvasc   ARF (acute renal failure) (HCC) - Likely in the setting of sepsis and UTI versus medications losartan-HCTZ and metformin - Placed metformin and losartan-HCTZ on hold - Continue IV fluids - Renal function improving   Hypokalemia - Likely due to sepsis - Supplemented, resovled   Controlled type 2 diabetes mellitus without complication, without long-term current use of insulin (HCC) - Continue Amaryl, Actos - Held metformin due to renal insufficiency during this admit  DVT prophylaxis:  - SCDs bilaterally  Epigastric pain - Likely GERD - Given GI cocktail. Start PPI with improvement - EKG unremarkable. Trop neg - Pain is positional and improves on sitting up, described as burning - Plan to d/c with PPI  Discharge Exam: Filed Vitals:   10/12/15 0909 10/12/15 1210 10/12/15 2031 10/13/15 0626  BP: 130/65 152/58 137/61 148/66  Pulse: 88 88 88 88  Temp:  98 F (36.7 C) 98.4 F (36.9 C) 98.6 F (37 C)  TempSrc:  Oral Oral Oral  Resp:  Height:      Weight:    81.738 kg (180 lb 3.2 oz)  SpO2:  98% 98% 95%    General: Awake, in nad Cardiovascular: regular, s1, s2 Respiratory: normal resp effort no wheezing  Discharge Instructions     Medication List    TAKE these medications  amLODipine 10 MG tablet  Commonly known as:  NORVASC  Take 10 mg by mouth every morning.     aspirin EC 81 MG tablet  Take 81 mg by mouth daily.     ciprofloxacin 500 MG tablet  Commonly known as:  CIPRO  Take 1 tablet (500 mg total) by mouth 2 (two) times daily.     cyclobenzaprine 10 MG tablet  Commonly known as:  FLEXERIL  Take 1 tablet (10 mg total) by mouth 3 (three) times daily as needed for muscle spasms.      fish oil-omega-3 fatty acids 1000 MG capsule  Take 1 g by mouth daily.     glimepiride 4 MG tablet  Commonly known as:  AMARYL  Take 4 mg by mouth daily.     losartan-hydrochlorothiazide 50-12.5 MG tablet  Commonly known as:  HYZAAR  Take 1 tablet by mouth daily.     metFORMIN 500 MG tablet  Commonly known as:  GLUCOPHAGE  Take 500 mg by mouth 2 (two) times daily with a meal.     multivitamin with minerals Tabs tablet  Take 1 tablet by mouth daily.     oxyCODONE-acetaminophen 5-325 MG tablet  Commonly known as:  PERCOCET/ROXICET  Take 1-2 tablets by mouth every 4 (four) hours as needed for pain.     pantoprazole 40 MG tablet  Commonly known as:  PROTONIX  Take 1 tablet (40 mg total) by mouth daily.     pioglitazone 15 MG tablet  Commonly known as:  ACTOS  Take 15 mg by mouth daily.       No Known Allergies Follow-up Information    Follow up with Lillia Mountain, MD. Schedule an appointment as soon as possible for a visit in 1 week.   Specialty:  Internal Medicine   Why:  Hospital follow up   Contact information:   [address] [place] [address] [phone]        The results of significant diagnostics from this hospitalization (including imaging, microbiology, ancillary and laboratory) are listed below for reference.    Significant Diagnostic Studies: Dg Chest 2 View  10/11/2015  CLINICAL DATA:  Cough, shortness of breath and weakness for 3 days EXAM: CHEST  2 VIEW COMPARISON:  07/24/2012 FINDINGS: Cardiomediastinal silhouette is normal. Mediastinal contours appear intact. There is no evidence of focal airspace consolidation, pleural effusion or pneumothorax. Osseous structures are without acute abnormality. 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 Soft tissues are grossly normal. IMPRESSION: No active cardiopulmonary disease. Electronically Signed   By: Ted Mcalpine M.D.   On: 10/11/2015 12:05     Microbiology: Recent Results (from the past 240 hour(s))  Urine C&S     Status: None   Collection Time: 10/11/15 11:13 AM  Result Value Ref Range Status   Specimen Description URINE, CLEAN CATCH  Final   Special Requests Normal  Final   Culture   Final    >=100,000 COLONIES/mL KLEBSIELLA OXYTOCA Performed at [facility]    Report Status 10/13/2015 FINAL  Final   Organism ID, Bacteria KLEBSIELLA OXYTOCA  Final      Susceptibility   Klebsiella oxytoca - MIC*    AMPICILLIN >=32 RESISTANT Resistant     CEFAZOLIN >=64 RESISTANT Resistant     CEFTRIAXONE <=1 SENSITIVE Sensitive     CIPROFLOXACIN <=0.25 SENSITIVE Sensitive     GENTAMICIN <=1 SENSITIVE Sensitive     IMIPENEM <=0.25 SENSITIVE Sensitive  NITROFURANTOIN <=16 SENSITIVE Sensitive     TRIMETH/SULFA <=20 SENSITIVE Sensitive     AMPICILLIN/SULBACTAM 16 INTERMEDIATE Intermediate     PIP/TAZO <=4 SENSITIVE Sensitive     * >=100,000 COLONIES/mL KLEBSIELLA OXYTOCA  Culture, blood (routine x 2)     Status: None (Preliminary result)   Collection Time: 10/11/15  5:10 PM  Result Value Ref Range Status   Specimen Description BLOOD LEFT ANTECUBITAL  Final   Special Requests BOTTLES DRAWN AEROBIC AND ANAEROBIC 10CC  Final   Culture   Final    NO GROWTH < 24 HOURS Performed at [facility]    Report Status PENDING  Incomplete  Culture, blood (routine x 2)     Status: None (Preliminary result)   Collection Time: 10/11/15  5:18 PM  Result Value Ref Range Status   Specimen Description BLOOD LEFT HAND  Final   Special Requests BOTTLES DRAWN AEROBIC AND ANAEROBIC 10 CC EACH  Final   Culture   Final    NO GROWTH < 24 HOURS Performed at [facility]    Report Status PENDING  Incomplete     Labs: Basic Metabolic Panel:  Recent Labs Lab 10/11/15 1108 10/12/15 0439 10/13/15 0345  NA 136 138 140  K 3.4* 3.4* 3.5  CL 98* 100* 104  CO2 GLUCOSE 312* 220* 188*  BUN CREATININE  1.35* 1.10 1.07  CALCIUM 9.4 8.7* 8.5*   Liver Function Tests:  Recent Labs Lab 10/12/15 0439  AST 19  ALT 15*  ALKPHOS 68  BILITOT 1.5*  PROT 6.6  ALBUMIN 3.5   No results for input(s): LIPASE, AMYLASE in the last 168 hours. No results for input(s): AMMONIA in the last 168 hours. CBC:  Recent Labs Lab 10/11/15 1108 10/12/15 0439 10/13/15 0345  WBC 22.0* 16.8* 11.3*  HGB 14.9 12.8* 11.8*  HCT 42.6 37.3* 34.7*  MCV 89.1 89.9 91.3  PLT 191 155 174   Cardiac Enzymes:  Recent Labs Lab 10/12/15 1405  TROPONINI <0.03   BNP: BNP (last 3 results) No results for input(s): BNP in the last 8760 hours.  ProBNP (last 3 results) No results for input(s): PROBNP in the last 8760 hours.  CBG:  Recent Labs Lab 10/12/15 0728 10/13/15 0724  GLUCAP 189* 186*    Signed:  Jennings Stirling K  [facility] 10/13/2015, 11:44 AM

## 2015-10-16 LAB — CULTURE, BLOOD (ROUTINE X 2)
Culture: NO GROWTH
Culture: NO GROWTH

## 2016-12-26 ENCOUNTER — Emergency Department (HOSPITAL_COMMUNITY): Payer: Medicare Other

## 2016-12-26 ENCOUNTER — Inpatient Hospital Stay (HOSPITAL_COMMUNITY)
Admission: EM | Admit: 2016-12-26 | Discharge: 2016-12-28 | DRG: 149 | Disposition: A | Discharge status: Home / Self Care | Payer: Medicare Other | Attending: Nephrology | Admitting: Nephrology

## 2016-12-26 ENCOUNTER — Observation Stay (HOSPITAL_COMMUNITY): Payer: Medicare Other

## 2016-12-26 ENCOUNTER — Encounter (HOSPITAL_COMMUNITY): Payer: Self-pay

## 2016-12-26 DIAGNOSIS — K219 Gastro-esophageal reflux disease without esophagitis: Secondary | ICD-10-CM | POA: Diagnosis not present

## 2016-12-26 DIAGNOSIS — R42 Dizziness and giddiness: Secondary | ICD-10-CM

## 2016-12-26 DIAGNOSIS — R2681 Unsteadiness on feet: Secondary | ICD-10-CM | POA: Diagnosis not present

## 2016-12-26 DIAGNOSIS — H8110 Benign paroxysmal vertigo, unspecified ear: Principal | ICD-10-CM | POA: Diagnosis present

## 2016-12-26 DIAGNOSIS — Z794 Long term (current) use of insulin: Secondary | ICD-10-CM

## 2016-12-26 DIAGNOSIS — Z7982 Long term (current) use of aspirin: Secondary | ICD-10-CM

## 2016-12-26 DIAGNOSIS — I10 Essential (primary) hypertension: Secondary | ICD-10-CM | POA: Diagnosis present

## 2016-12-26 DIAGNOSIS — E119 Type 2 diabetes mellitus without complications: Secondary | ICD-10-CM | POA: Diagnosis not present

## 2016-12-26 DIAGNOSIS — I639 Cerebral infarction, unspecified: Secondary | ICD-10-CM | POA: Diagnosis present

## 2016-12-26 DIAGNOSIS — I671 Cerebral aneurysm, nonruptured: Secondary | ICD-10-CM | POA: Diagnosis present

## 2016-12-26 HISTORY — DX: Gastro-esophageal reflux disease without esophagitis: K21.9

## 2016-12-26 LAB — DIFFERENTIAL
Basophils Absolute: 0 10*3/uL (ref 0.0–0.1)
Basophils Relative: 0 %
Eosinophils Absolute: 0.3 10*3/uL (ref 0.0–0.7)
Eosinophils Relative: 3 %
Lymphocytes Relative: 18 %
Lymphs Abs: 1.4 10*3/uL (ref 0.7–4.0)
Monocytes Absolute: 0.9 10*3/uL (ref 0.1–1.0)
Monocytes Relative: 11 %
Neutro Abs: 5.6 10*3/uL (ref 1.7–7.7)
Neutrophils Relative %: 68 %

## 2016-12-26 LAB — URINALYSIS, ROUTINE W REFLEX MICROSCOPIC
Bilirubin Urine: NEGATIVE
Glucose, UA: 50 mg/dL — AB
Hgb urine dipstick: NEGATIVE
Ketones, ur: NEGATIVE mg/dL
Leukocytes, UA: NEGATIVE
Nitrite: NEGATIVE
Protein, ur: NEGATIVE mg/dL
Specific Gravity, Urine: 1.024 (ref 1.005–1.030)
pH: 7 (ref 5.0–8.0)

## 2016-12-26 LAB — PROTIME-INR
INR: 1.05
Prothrombin Time: 13.7 seconds (ref 11.4–15.2)

## 2016-12-26 LAB — COMPREHENSIVE METABOLIC PANEL
ALT: 16 U/L — ABNORMAL LOW (ref 17–63)
AST: 18 U/L (ref 15–41)
Albumin: 4.3 g/dL (ref 3.5–5.0)
Alkaline Phosphatase: 79 U/L (ref 38–126)
Anion gap: 10 (ref 5–15)
BUN: 16 mg/dL (ref 6–20)
CO2: 29 mmol/L (ref 22–32)
Calcium: 9.7 mg/dL (ref 8.9–10.3)
Chloride: 99 mmol/L — ABNORMAL LOW (ref 101–111)
Creatinine, Ser: 1.15 mg/dL (ref 0.61–1.24)
GFR calc Af Amer: >60 mL/min (ref >60)
GFR calc non Af Amer: >60 mL/min (ref >60)
Glucose, Bld: 186 mg/dL — ABNORMAL HIGH (ref 65–99)
Potassium: 3.6 mmol/L (ref 3.5–5.1)
Sodium: 138 mmol/L (ref 135–145)
Total Bilirubin: 0.7 mg/dL (ref 0.3–1.2)
Total Protein: 7.3 g/dL (ref 6.5–8.1)

## 2016-12-26 LAB — CBG MONITORING, ED: Glucose-Capillary: 173 mg/dL — ABNORMAL HIGH (ref 65–99)

## 2016-12-26 LAB — I-STAT TROPONIN, ED: Troponin i, poc: 0 ng/mL (ref 0.00–0.08)

## 2016-12-26 LAB — APTT: aPTT: 35 seconds (ref 24–36)

## 2016-12-26 LAB — MAGNESIUM: Magnesium: 1.7 mg/dL (ref 1.7–2.4)

## 2016-12-26 LAB — I-STAT CHEM 8, ED
BUN: 20 mg/dL (ref 6–20)
Calcium, Ion: 1.13 mmol/L — ABNORMAL LOW (ref 1.15–1.40)
Chloride: 98 mmol/L — ABNORMAL LOW (ref 101–111)
Creatinine, Ser: 1.1 mg/dL (ref 0.61–1.24)
Glucose, Bld: 189 mg/dL — ABNORMAL HIGH (ref 65–99)
HCT: 43 % (ref 39.0–52.0)
Hemoglobin: 14.6 g/dL (ref 13.0–17.0)
Potassium: 3.5 mmol/L (ref 3.5–5.1)
Sodium: 139 mmol/L (ref 135–145)
TCO2: 30 mmol/L (ref 0–100)

## 2016-12-26 LAB — CBC
HCT: 42.2 % (ref 39.0–52.0)
Hemoglobin: 14.5 g/dL (ref 13.0–17.0)
MCH: 29.9 pg (ref 26.0–34.0)
MCHC: 34.4 g/dL (ref 30.0–36.0)
MCV: 87 fL (ref 78.0–100.0)
Platelets: 154 10*3/uL (ref 150–400)
RBC: 4.85 MIL/uL (ref 4.22–5.81)
RDW: 13 % (ref 11.5–15.5)
WBC: 8.2 10*3/uL (ref 4.0–10.5)

## 2016-12-26 LAB — TSH: TSH: 5.624 u[IU]/mL — ABNORMAL HIGH (ref 0.350–4.500)

## 2016-12-26 LAB — RAPID URINE DRUG SCREEN, HOSP PERFORMED
Amphetamines: NOT DETECTED
Barbiturates: NOT DETECTED
Benzodiazepines: NOT DETECTED
Cocaine: NOT DETECTED
Opiates: NOT DETECTED
Tetrahydrocannabinol: NOT DETECTED

## 2016-12-26 LAB — ETHANOL: Alcohol, Ethyl (B): <5 mg/dL (ref <5)

## 2016-12-26 MED ORDER — ALPRAZOLAM 0.25 MG PO TABS
0.2500 mg | ORAL_TABLET | Freq: Every day | ORAL | Status: DC
  Administered 2016-12-26 – 2016-12-27 (×2): 0.25 mg via ORAL
  Filled 2016-12-26 (×2): qty 1

## 2016-12-26 MED ORDER — ENOXAPARIN SODIUM 40 MG/0.4ML ~~LOC~~ SOLN
40.0000 mg | SUBCUTANEOUS | Status: DC
  Administered 2016-12-26 – 2016-12-27 (×2): 40 mg via SUBCUTANEOUS
  Filled 2016-12-26 (×2): qty 0.4

## 2016-12-26 MED ORDER — MECLIZINE HCL 25 MG PO TABS
25.0000 mg | ORAL_TABLET | Freq: Once | ORAL | Status: AC
  Administered 2016-12-26: 25 mg via ORAL
  Filled 2016-12-26: qty 1

## 2016-12-26 MED ORDER — LORAZEPAM 2 MG/ML IJ SOLN: 1.0000 mg | Freq: Once | INTRAMUSCULAR | Status: AC

## 2016-12-26 MED ORDER — ACETAMINOPHEN 325 MG PO TABS: 650.0000 mg | ORAL_TABLET | Freq: Four times a day (QID) | ORAL | Status: DC | PRN

## 2016-12-26 MED ORDER — ADULT MULTIVITAMIN W/MINERALS CH
1.0000 | ORAL_TABLET | Freq: Every day | ORAL | Status: DC
  Administered 2016-12-27 – 2016-12-28 (×2): 1 via ORAL
  Filled 2016-12-26 (×2): qty 1

## 2016-12-26 MED ORDER — ACETAMINOPHEN 650 MG RE SUPP: 650.0000 mg | Freq: Four times a day (QID) | RECTAL | Status: DC | PRN

## 2016-12-26 MED ORDER — ONDANSETRON HCL 4 MG/2ML IJ SOLN: 4.0000 mg | Freq: Four times a day (QID) | INTRAMUSCULAR | Status: DC | PRN

## 2016-12-26 MED ORDER — PANTOPRAZOLE SODIUM 40 MG PO TBEC
40.0000 mg | DELAYED_RELEASE_TABLET | Freq: Every day | ORAL | Status: DC
  Administered 2016-12-26 – 2016-12-28 (×3): 40 mg via ORAL
  Filled 2016-12-26 (×3): qty 1

## 2016-12-26 MED ORDER — LORAZEPAM 2 MG/ML IJ SOLN
INTRAMUSCULAR | Status: AC
  Administered 2016-12-26: 0.5 mg
  Filled 2016-12-26: qty 1

## 2016-12-26 MED ORDER — INSULIN ASPART 100 UNIT/ML ~~LOC~~ SOLN
0.0000 [IU] | Freq: Three times a day (TID) | SUBCUTANEOUS | Status: DC
  Administered 2016-12-27: 5 [IU] via SUBCUTANEOUS
  Administered 2016-12-27: 11 [IU] via SUBCUTANEOUS
  Administered 2016-12-27 – 2016-12-28 (×3): 3 [IU] via SUBCUTANEOUS

## 2016-12-26 MED ORDER — MECLIZINE HCL 25 MG PO TABS
25.0000 mg | ORAL_TABLET | Freq: Three times a day (TID) | ORAL | Status: DC
  Administered 2016-12-26: 25 mg via ORAL
  Filled 2016-12-26: qty 1

## 2016-12-26 MED ORDER — ALBUTEROL SULFATE (2.5 MG/3ML) 0.083% IN NEBU
2.5000 mg | INHALATION_SOLUTION | RESPIRATORY_TRACT | Status: DC | PRN
Start: 2016-12-26 — End: 2016-12-28

## 2016-12-26 MED ORDER — SODIUM CHLORIDE 0.9 % IV BOLUS (SEPSIS)
500.0000 mL | Freq: Once | INTRAVENOUS | Status: AC
  Administered 2016-12-26: 500 mL via INTRAVENOUS

## 2016-12-26 MED ORDER — GADOBENATE DIMEGLUMINE 529 MG/ML IV SOLN
18.0000 mL | Freq: Once | INTRAVENOUS | Status: AC | PRN
  Administered 2016-12-26: 18 mL via INTRAVENOUS

## 2016-12-26 MED ORDER — AMLODIPINE BESYLATE 10 MG PO TABS
10.0000 mg | ORAL_TABLET | Freq: Every day | ORAL | Status: DC
  Administered 2016-12-27 – 2016-12-28 (×2): 10 mg via ORAL
  Filled 2016-12-26 (×2): qty 1

## 2016-12-26 MED ORDER — OMEGA-3 FATTY ACIDS 1000 MG PO CAPS
1.0000 g | ORAL_CAPSULE | Freq: Every day | ORAL | Status: DC
  Filled 2016-12-26: qty 1

## 2016-12-26 MED ORDER — LORAZEPAM 2 MG/ML IJ SOLN
0.5000 mg | Freq: Once | INTRAMUSCULAR | Status: AC
  Administered 2016-12-26: 0.5 mg via INTRAVENOUS
  Filled 2016-12-26: qty 1

## 2016-12-26 MED ORDER — SORBITOL 70 % SOLN
30.0000 mL | Freq: Every day | Status: DC | PRN
  Filled 2016-12-26: qty 30

## 2016-12-26 MED ORDER — IOPAMIDOL (ISOVUE-370) INJECTION 76%
50.0000 mL | Freq: Once | INTRAVENOUS | Status: AC | PRN
  Administered 2016-12-26: 50 mL via INTRAVENOUS

## 2016-12-26 MED ORDER — NAPROXEN SODIUM 275 MG PO TABS
275.0000 mg | ORAL_TABLET | Freq: Two times a day (BID) | ORAL | Status: DC | PRN
  Filled 2016-12-26: qty 1

## 2016-12-26 MED ORDER — ASPIRIN EC 81 MG PO TBEC: 81.0000 mg | DELAYED_RELEASE_TABLET | Freq: Every day | ORAL | Status: DC

## 2016-12-26 MED ORDER — NAPROXEN SODIUM 220 MG PO TABS
220.0000 mg | ORAL_TABLET | Freq: Two times a day (BID) | ORAL | Status: DC | PRN
  Filled 2016-12-26: qty 1

## 2016-12-26 MED ORDER — SENNOSIDES-DOCUSATE SODIUM 8.6-50 MG PO TABS: 1.0000 | ORAL_TABLET | Freq: Every evening | ORAL | Status: DC | PRN

## 2016-12-26 MED ORDER — TRAMADOL HCL 50 MG PO TABS: 50.0000 mg | ORAL_TABLET | Freq: Four times a day (QID) | ORAL | Status: DC | PRN

## 2016-12-26 MED ORDER — SODIUM CHLORIDE 0.9% FLUSH
3.0000 mL | Freq: Two times a day (BID) | INTRAVENOUS | Status: DC
  Administered 2016-12-26 – 2016-12-27 (×2): 3 mL via INTRAVENOUS

## 2016-12-26 MED ORDER — ONDANSETRON HCL 4 MG PO TABS: 4.0000 mg | ORAL_TABLET | Freq: Four times a day (QID) | ORAL | Status: DC | PRN

## 2016-12-26 MED ORDER — MAGNESIUM CITRATE PO SOLN: 1.0000 | Freq: Once | ORAL | Status: DC | PRN

## 2016-12-26 MED ORDER — SODIUM CHLORIDE 0.9 % IV SOLN
100.0000 mL/h | INTRAVENOUS | Status: DC
  Administered 2016-12-26: 100 mL/h via INTRAVENOUS

## 2016-12-26 NOTE — ED Notes (Signed)
Pt to go upstairs after MRI 

## 2016-12-26 NOTE — Progress Notes (Signed)
New patient admitted through ER with dizziness. Here for stroke  Work up. On arrival patient verbal and oriented. Made him comfortable and vital signs checked. Placed him on cardiac monitor and CCMD notified. Safety measures put in place as well. Will keep monitoring.

## 2016-12-26 NOTE — ED Provider Notes (Signed)
[facility]Y DEPT Provider Note   CSN: [id number] Arrival date & time: 12/26/16  1529     History   Chief Complaint Chief Complaint  Patient presents with  . Dizziness    HPI [name] is a 76 y.o. male.  The history is provided by the patient.  Dizziness  Quality:  Head spinning and imbalance Severity:  Severe Onset quality:  Sudden Duration:  2 hours Timing:  Constant Progression:  Unchanged Chronicity:  New Context: bending over and physical activity   Context: not with ear pain, not with eye movement, not with loss of consciousness and not with medication   Relieved by:  Nothing Worsened by:  Nothing Ineffective treatments:  Being still, change in position and turning head Associated symptoms: vision changes   Associated symptoms: no chest pain, no diarrhea, no headaches, no hearing loss, no nausea, no palpitations, no shortness of breath, no syncope, no tinnitus and no vomiting   Risk factors: multiple medications   Risk factors: no hx of vertigo and no new medications     Past Medical History:  Diagnosis Date  . Diabetes mellitus without complication (HCC)   . Hypertension     Patient Active Problem List   Diagnosis Date Noted  . Dizziness 12/26/2016  . Benign essential HTN 10/11/2015  . Leukocytosis 10/11/2015  . ARF (acute renal failure) (HCC) 10/11/2015  . Hypokalemia 10/11/2015  . Controlled type 2 diabetes mellitus without complication, without long-term current use of insulin (HCC) 10/11/2015  . Sepsis secondary to UTI (HCC) 10/11/2015    Past Surgical History:  Procedure Laterality Date  . BACK SURGERY    . COLONOSCOPY WITH PROPOFOL N/A 11/18/2014   Procedure: COLONOSCOPY WITH PROPOFOL;  Surgeon: Charolett Bumpers, MD;  Location: [facility];  Service: Endoscopy;  Laterality: N/A;  . ROTATOR CUFF REPAIR     bilateral shoulder  . THROAT SURGERY     benign tumor removed       Home Medications    Prior to Admission medications    Medication Sig Start Date End Date Taking? Authorizing Provider  acetaminophen (TYLENOL) 500 MG tablet Take 1,000 mg by mouth every 6 (six) hours as needed for headache (pain).   Yes Historical Provider, MD  ALPRAZolam Prudy Feeler) 0.5 MG tablet Take 0.25 mg by mouth at bedtime. 09/28/16  Yes Historical Provider, MD  amLODipine (NORVASC) 10 MG tablet Take 10 mg by mouth daily.    Yes Historical Provider, MD  aspirin EC 81 MG tablet Take 81 mg by mouth daily.   Yes Historical Provider, MD  fish oil-omega-3 fatty acids 1000 MG capsule Take 1 g by mouth daily.   Yes Historical Provider, MD  glimepiride (AMARYL) 4 MG tablet Take 4 mg by mouth daily.   Yes Historical Provider, MD  Homeopathic Products (LEG CRAMP RELIEF) TABS Take 1 tablet by mouth at bedtime.   Yes Historical Provider, MD  losartan-hydrochlorothiazide (HYZAAR) 50-12.5 MG tablet Take 1 tablet by mouth daily. 10/09/15  Yes Historical Provider, MD  metFORMIN (GLUCOPHAGE) 500 MG tablet Take 500 mg by mouth 2 (two) times daily with a meal.   Yes Historical Provider, MD  Multiple Vitamin (MULTIVITAMIN WITH MINERALS) TABS Take 1 tablet by mouth daily.   Yes Historical Provider, MD  naproxen sodium (ALEVE) 220 MG tablet Take 220 mg by mouth 2 (two) times daily as needed (pain).   Yes Historical Provider, MD  pioglitazone (ACTOS) 15 MG tablet Take 15 mg by mouth daily.   Yes  Historical Provider, MD  cyclobenzaprine (FLEXERIL) 10 MG tablet Take 1 tablet (10 mg total) by mouth 3 (three) times daily as needed for muscle spasms. Patient not taking: Reported on 11/13/2014 08/03/12   Shirlean Kelly, MD  oxyCODONE-acetaminophen (PERCOCET/ROXICET) 5-325 MG per tablet Take 1-2 tablets by mouth every 4 (four) hours as needed for pain. Patient not taking: Reported on 11/13/2014 08/03/12   Shirlean Kelly, MD  pantoprazole (PROTONIX) 40 MG tablet Take 1 tablet (40 mg total) by mouth daily. Patient not taking: Reported on 12/26/2016 10/13/15   Jerald Kief, MD     Family History No family history on file.  Social History Social History  Substance Use Topics  . Smoking status: Never Smoker  . Smokeless tobacco: Never Used  . Alcohol use No     Allergies   Patient has no known allergies.   Review of Systems Review of Systems  Constitutional: Negative for chills and fever.  HENT: Negative for ear pain, hearing loss, sore throat and tinnitus.   Eyes: Negative for pain and visual disturbance.  Respiratory: Negative for cough and shortness of breath.   Cardiovascular: Negative for chest pain, palpitations and syncope.  Gastrointestinal: Negative for abdominal pain, diarrhea, nausea and vomiting.  Endocrine: Negative for polydipsia.  Genitourinary: Negative for dysuria and hematuria.  Musculoskeletal: Positive for gait problem. Negative for arthralgias, back pain and neck pain.  Skin: Negative for color change and rash.  Neurological: Positive for dizziness. Negative for seizures, syncope, facial asymmetry, speech difficulty, numbness and headaches.  Psychiatric/Behavioral: Negative for confusion.  All other systems reviewed and are negative.    Physical Exam Updated Vital Signs BP (!) 159/67 (BP Location: Right Arm)   Pulse 84   Temp 97.8 F (36.6 C) (Oral)   Resp 16   Ht 6\' 1"  (1.854 m)   Wt 84.4 kg   SpO2 99%   BMI 24.54 kg/m   Physical Exam  Constitutional: He is oriented to person, place, and time. He appears well-developed and well-nourished.  HENT:  Head: Normocephalic and atraumatic.  Eyes: Conjunctivae and EOM are normal. Pupils are equal, round, and reactive to light.  Neck: Neck supple.  Cardiovascular: Normal rate and regular rhythm.   No murmur heard. Pulmonary/Chest: Effort normal and breath sounds normal. No respiratory distress.  Abdominal: Soft. There is no tenderness.  Musculoskeletal: He exhibits no edema.  Neurological: He is alert and oriented to person, place, and time. He has normal strength. No  cranial nerve deficit or sensory deficit. Gait abnormal. GCS eye subscore is 4. GCS verbal subscore is 5. GCS motor subscore is 6.  Pt with normal finger to nose and heel to shin.  Pt with very purposeful gait and appears unsteady.  Does not lean to one side  Skin: Skin is warm and dry.  Psychiatric: He has a normal mood and affect.  Nursing note and vitals reviewed.    ED Treatments / Results  Labs (all labs ordered are listed, but only abnormal results are displayed) Labs Reviewed  COMPREHENSIVE METABOLIC PANEL - Abnormal; Notable for the following:       Result Value   Chloride 99 (*)    Glucose, Bld 186 (*)    ALT 16 (*)    All other components within normal limits  URINALYSIS, ROUTINE W REFLEX MICROSCOPIC - Abnormal; Notable for the following:    Glucose, UA 50 (*)    All other components within normal limits  I-STAT CHEM 8, ED - Abnormal; Notable  for the following:    Chloride 98 (*)    Glucose, Bld 189 (*)    Calcium, Ion 1.13 (*)    All other components within normal limits  CBG MONITORING, ED - Abnormal; Notable for the following:    Glucose-Capillary 173 (*)    All other components within normal limits  ETHANOL  PROTIME-INR  APTT  CBC  DIFFERENTIAL  RAPID URINE DRUG SCREEN, HOSP PERFORMED  I-STAT TROPOININ, ED    EKG  EKG Interpretation None       Radiology Ct Angio Head W Or Wo Contrast  Result Date: 12/26/2016 CLINICAL DATA:  Code stroke, dizziness and unsteady gait EXAM: CT ANGIOGRAPHY HEAD AND NECK TECHNIQUE: Multidetector CT imaging of the head and neck was performed using the standard protocol during bolus administration of intravenous contrast. Multiplanar CT image reconstructions and MIPs were obtained to evaluate the vascular anatomy. Carotid stenosis measurements (when applicable) are obtained utilizing NASCET criteria, using the distal internal carotid diameter as the denominator. CONTRAST:  50 mL Isovue 370 IV COMPARISON:  CT head 12/26/2016  FINDINGS: CTA NECK FINDINGS Aortic arch: Minimal atherosclerotic disease aortic arch. Left vertebral artery origin from the aortic arch. Proximal great vessels patent Right carotid system: Mild atherosclerotic disease at the right carotid bifurcation without significant stenosis. Left carotid system: Normal Vertebral arteries: Both vertebral arteries patent to the basilar without significant stenosis. Skeleton: Extensive disc and facet degeneration throughout the cervical spine with multilevel spinal and foraminal stenosis. Extensive facet degeneration. Other neck: Negative for mass or adenopathy Upper chest: 8 mm right upper lobe nodule. Remainder of the upper lobes are clear Review of the MIP images confirms the above findings CTA HEAD FINDINGS Anterior circulation: Mild atherosclerotic calcification in the cavernous carotid bilaterally without significant stenosis. Anterior and middle cerebral arteries patent bilaterally without significant stenosis. 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 Mild stenosis distal right vertebral artery due to calcified plaque. PICA patent bilaterally. Basilar patent. Superior cerebellar and posterior cerebral arteries patent bilaterally. Venous sinuses: Patent Anatomic variants: None Delayed phase: Not performed Review of the MIP images confirms the above findings IMPRESSION: No significant carotid or vertebral artery stenosis. No emergent  large vessel occlusion. 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 8 mm right upper lobe nodule. Recommend elective chest CT for further evaluation. These results were called by telephone at the time of interpretation on 12/26/2016 at 4:45 pm to Dr. Jonna Munro , who verbally acknowledged these results. Electronically Signed   By: Marlan Palau M.D.   On: 12/26/2016 16:46   Ct Angio Neck W Or Wo Contrast  Result Date:  12/26/2016 CLINICAL DATA:  Code stroke, dizziness and unsteady gait EXAM: CT ANGIOGRAPHY HEAD AND NECK TECHNIQUE: Multidetector CT imaging of the head and neck was performed using the standard protocol during bolus administration of intravenous contrast. Multiplanar CT image reconstructions and MIPs were obtained to evaluate the vascular anatomy. Carotid stenosis measurements (when applicable) are obtained utilizing NASCET criteria, using the distal internal carotid diameter as the denominator. CONTRAST:  50 mL Isovue 370 IV COMPARISON:  CT head 12/26/2016 FINDINGS: CTA NECK FINDINGS Aortic arch: Minimal atherosclerotic disease aortic arch. Left vertebral artery origin from the aortic arch. Proximal great vessels patent Right carotid system: Mild atherosclerotic disease at the right carotid bifurcation without significant stenosis. Left carotid system: Normal Vertebral arteries: Both vertebral arteries patent to the basilar without significant stenosis. Skeleton: Extensive disc  and facet degeneration throughout the cervical spine with multilevel spinal and foraminal stenosis. Extensive facet degeneration. Other neck: Negative for mass or adenopathy Upper chest: 8 mm right upper lobe nodule. Remainder of the upper lobes are clear Review of the MIP images confirms the above findings CTA HEAD FINDINGS Anterior circulation: Mild atherosclerotic calcification in the cavernous carotid bilaterally without significant stenosis. Anterior and middle cerebral arteries patent bilaterally without significant stenosis. 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 Mild stenosis distal right vertebral artery due to calcified plaque. PICA patent bilaterally. Basilar patent. Superior cerebellar and posterior cerebral arteries patent bilaterally. Venous sinuses: Patent Anatomic variants: None Delayed phase: Not performed Review of the MIP images confirms the  above findings IMPRESSION: No significant carotid or vertebral artery stenosis. No emergent  large vessel occlusion. 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 8 mm right upper lobe nodule. Recommend elective chest CT for further evaluation. These results were called by telephone at the time of interpretation on 12/26/2016 at 4:45 pm to Dr. Jonna Munro , who verbally acknowledged these results. Electronically Signed   By: Marlan Palau M.D.   On: 12/26/2016 16:46   Ct Head Code Stroke W/o Cm  Addendum Date: 12/26/2016   ADDENDUM REPORT: 12/26/2016 16:47 ADDENDUM: These results were called by telephone at the time of interpretation on 12/26/2016 at 4:35 pm to Dr. Lavonna Monarch, who verbally acknowledged these results. Electronically Signed   By: Marlan Palau M.D.   On: 12/26/2016 16:47   Result Date: 12/26/2016 CLINICAL DATA:  Code stroke.  Dizziness and unsteady gait EXAM: CT HEAD WITHOUT CONTRAST TECHNIQUE: Contiguous axial images were obtained from the base of the skull through the vertex without intravenous contrast. COMPARISON:  None. FINDINGS: Brain: Mild atrophy. Patchy hypodensity in the cerebral white matter bilaterally with a chronic appearance. No acute cortical infarct. Negative for hemorrhage or mass Vascular: Atherosclerotic calcification. Negative for hyperdense vessel Skull: Negative Sinuses/Orbits: Negative Other: None ASPECTS (Alberta Stroke Program Early CT Score) - Ganglionic level infarction (caudate, lentiform nuclei, internal capsule, insula, M1-M3 cortex): 7 - Supraganglionic infarction (M4-M6 cortex): 3 Total score (0-10 with 10 being normal): 10 IMPRESSION: 1. Negative for acute infarct. 2. ASPECTS is 10 3. Atrophy and mild chronic white matter changes. Electronically Signed: By: Marlan Palau M.D. On: 12/26/2016 16:36    Procedures Procedures (including critical care time)  Medications Ordered in ED Medications  sodium chloride 0.9 % bolus 500 mL (not  administered)    Followed by  0.9 %  sodium chloride infusion (not administered)  meclizine (ANTIVERT) tablet 25 mg (not administered)  iopamidol (ISOVUE-370) 76 % injection 50 mL (50 mLs Intravenous Contrast Given 12/26/16 1619)     Initial Impression / Assessment and Plan / ED Course  I have reviewed the triage vital signs and the nursing notes.  Pertinent labs & imaging results that were available during my care of the patient were reviewed by me and considered in my medical decision making (see chart for details).     75 year old male history of hypertension, hyperlipidemia, diabetes who presents to the setting of dizziness. Patient reports he was out working in his garden and bent down to pick up a wooden board when he had sudden onset of dizziness. He describes this as feeling unsteady and that things were moving. Patient reports he had unsteady gait after this and EMS was called.  On arrival patient was hemodynamically stable and  afebrile. On neurologic exam patient had unsteadiness to gait. No leaning to one side or the other. Patient otherwise neurovascularly intact with normal finger-nose and heel shin examination. Movement of head did not make the dizziness worse and at this time I have concern for acute cerebellar stroke. Code stroke initiated.  CT head did not reveal acute stroke. Neurology team quickly assessed patient and believe symptoms could be related to peripheral vertigo versus TIA versus possible stroke. Do not believe TPA is indicated at this time. Patient given meclizine and will be admitted to medicine for further CVA workup. EKG revealed no signs of acute ischemia or other significant abdomen mildly. No significant laboratory abnormalities noted. On reassessment dizziness not significantly changed. Patient stable at time of transfer of care.  Final Clinical Impressions(s) / ED Diagnoses   Final diagnoses:  Unsteady gait  Dizziness    New Prescriptions New  Prescriptions   No medications on file     Stacy Gardner, MD 12/26/16 1942    Gerhard Munch, MD 12/27/16 2302

## 2016-12-26 NOTE — ED Notes (Signed)
Patient transported to MRI 

## 2016-12-26 NOTE — Consult Note (Signed)
Came to bedside to evaluate patient after stroke code was called, on arrival history was taken from pt and family at bedside. Pt reported that he was working in the garden and was lifting heavy objects when he felt sudden dizziness, he almost faimted but did not, he leaned to a chair and sat dwn, he denied any focal weakness, no LOC,  No slurred speech but some blurry vision. On exam NIHSS 0 no neurological deficit CT brain showed no acute intracranial changes CTA showed no LVO Stroke code was cancelled, pt to be admitted toi medicine for syncopal even.  Jonna Munro, MD [facility]

## 2016-12-26 NOTE — ED Notes (Signed)
Attempted report x1. 

## 2016-12-26 NOTE — Code Documentation (Signed)
76 y.o. Male w/ PMHx of HTN and DM who was working in his yard today lifting heavy objects when he had a sudden onset of dizziness and blurry vision. Family notified [facility] and was subsequently brought to [facility] ED in code stroke status. Labs were drawn and pt taken to CT. CT (-) ASPECTS 10. CTA (-) per neuro MD. On assessment pt w/ NIHSS of 0. tPA not given d/t stroke not suspected. Code stroke canceled per neuro MD. Bedside handoff w/ ED RN Ladona Ridgel.

## 2016-12-26 NOTE — ED Notes (Signed)
Pt returned from MRI °

## 2016-12-26 NOTE — ED Triage Notes (Signed)
Pt from home with ems out working in the yard today. Pt leaned ober and felt a sudden onset of dizziness. Pt c/o blurred vision and denies LOC. Pt alert and oriented. PT has hx of HTN and Diabetes. Pt given saline with no improvement of symptoms. BP 166/90 HR 87 sinus rhythm, 97% room air, nad at this time.

## 2016-12-26 NOTE — H&P (Signed)
History and Physical    [name] HYQ:[id number] DOB: Feb 10, 1941 DOA: 12/26/2016  PCP: Lillia Mountain, MD  Patient coming from: Home  I have personally briefly reviewed patient's old medical records in [facility]  Chief Complaint: Dizziness  HPI: [name] is a 76 y.o. male with medical history significant of hypertension, type 2 diabetes mellitus, gastroesophageal reflux disease presented to the ED with sudden onset of dizziness/spinning sensation. Patient states he was working in the garden moving both around when he stood up to get a board and suddenly had a sensation of dizziness. Patient stated that he felt like everything around him was spinning including the buildings. Patient stated he also had some gait abnormality was able to make his way to a chair where he was able to sit down and called his wife to bring him some water. Patient stated that he felt the builders was spinning around him like a hurricane. Patient denies any nausea or vomiting. No fever no chills. No dysuria, no diarrhea, no constipation, no loss of consciousness, no chest pain, no shortness of breath, no abdominal pain, no slurred speech, no facial droop, no asymmetric weakness or numbness, no tingling, no melena, no hematemesis, no hematochezia. Patient does endorse some indigestion. Patient states while on the stretcher in the ED whenever he sits up he does endorse a significant spinning sensation and dizziness. Patient does not feel he was dehydrated. [facility] were called to admit the patient for further evaluation and management.  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 EKG showed normal sinus rhythm. Compressive metabolic profile  with a chloride of 99 glucose of 186 otherwise was within normal limits. Point-of-care troponin negative. CBC unremarkable. Urinalysis nitrite negative leukocytes negative. UDS was negative. A call level less than 5. Neurology was also consulted who assessed the patient and noted the patient to have a NIH SS of 0 with no neurological deficit, CT brain with no acute intracranial changes. Culture was canceled. Neurology recommended patient be admitted for syncopal evaluation.  Review of Systems: As per HPI otherwise 10 point review of systems negative.   Past Medical History:  Diagnosis Date  . Diabetes mellitus without complication (HCC)   . GERD (gastroesophageal reflux disease) 12/26/2016  . Hypertension     Past Surgical History:  Procedure Laterality Date  . BACK SURGERY    . COLONOSCOPY WITH PROPOFOL N/A 11/18/2014   Procedure: COLONOSCOPY WITH PROPOFOL;  Surgeon: Charolett Bumpers, MD;  Location: [facility];  Service: Endoscopy;  Laterality: N/A;  . ROTATOR CUFF REPAIR     bilateral shoulder  . THROAT SURGERY     benign tumor removed     reports that he has never smoked. He has never used smokeless tobacco. He reports that he does not drink alcohol or use drugs.  No Known Allergies  Family History  Problem Relation Age of Onset  . Heart failure Mother   . Dementia Mother    Father deceased age 86 and 64, patient not sure of cause of death. Mother deceased age 64 had a history of CHF and Alzheimer's dementia.  Prior to Admission medications   Medication Sig Start Date End Date Taking? Authorizing Provider  acetaminophen (TYLENOL) 500 MG tablet Take 1,000 mg by  mouth every 6 (six) hours as needed for headache (pain).   Yes Historical Provider, MD  ALPRAZolam Prudy Feeler) 0.5 MG tablet Take 0.25 mg by mouth at bedtime. 09/28/16  Yes Historical Provider, MD  amLODipine (NORVASC) 10 MG tablet Take 10 mg by mouth daily.    Yes Historical Provider, MD  aspirin EC 81 MG tablet Take 81 mg  by mouth daily.   Yes Historical Provider, MD  fish oil-omega-3 fatty acids 1000 MG capsule Take 1 g by mouth daily.   Yes Historical Provider, MD  glimepiride (AMARYL) 4 MG tablet Take 4 mg by mouth daily.   Yes Historical Provider, MD  Homeopathic Products (LEG CRAMP RELIEF) TABS Take 1 tablet by mouth at bedtime.   Yes Historical Provider, MD  losartan-hydrochlorothiazide (HYZAAR) 50-12.5 MG tablet Take 1 tablet by mouth daily. 10/09/15  Yes Historical Provider, MD  metFORMIN (GLUCOPHAGE) 500 MG tablet Take 500 mg by mouth 2 (two) times daily with a meal.   Yes Historical Provider, MD  Multiple Vitamin (MULTIVITAMIN WITH MINERALS) TABS Take 1 tablet by mouth daily.   Yes Historical Provider, MD  naproxen sodium (ALEVE) 220 MG tablet Take 220 mg by mouth 2 (two) times daily as needed (pain).   Yes Historical Provider, MD  pioglitazone (ACTOS) 15 MG tablet Take 15 mg by mouth daily.   Yes Historical Provider, MD  pantoprazole (PROTONIX) 40 MG tablet Take 1 tablet (40 mg total) by mouth daily. Patient not taking: Reported on 12/26/2016 10/13/15   Jerald Kief, MD    Physical Exam: Vitals:   12/26/16 1700 12/26/16 1715 12/26/16 1730 12/26/16 1746  BP: (!) 148/75 139/75 (!) 150/79   Pulse: 88 86 92   Resp: Temp:    97.8 F (36.6 C)  TempSrc:      SpO2: 97% 96% 96%   Weight:      Height:        Constitutional: NAD, calm, comfortable Vitals:   12/26/16 1700 12/26/16 1715 12/26/16 1730 12/26/16 1746  BP: (!) 148/75 139/75 (!) 150/79   Pulse: 88 86 92   Resp: Temp:    97.8 F (36.6 C)  TempSrc:      SpO2: 97% 96% 96%   Weight:      Height:       Eyes: PERRLA, EOMI, lids and conjunctivae normal ENMT: Mucous membranes are moist. Posterior pharynx clear of any exudate or lesions.Normal dentition.  Neck: normal, supple, no masses, no thyromegaly Respiratory: clear to auscultation bilaterally, no wheezing, no crackles. Normal respiratory effort. No accessory muscle  use.  Cardiovascular: Regular rate and rhythm, no murmurs / rubs / gallops. No extremity edema. 2+ pedal pulses. No carotid bruits.  Abdomen: no tenderness, no masses palpated. No hepatosplenomegaly. Bowel sounds positive.  Musculoskeletal: no clubbing / cyanosis. No joint deformity upper and lower extremities. Good ROM, no contractures. Normal muscle tone.  Skin: no rashes, lesions, ulcers. No induration Neurologic: CN 2-12 grossly intact. Sensation intact, DTR normal. Strength 5/5 in all 4.  Psychiatric: Normal judgment and insight. Alert and oriented x 3. Normal mood.   Labs on Admission: I have personally reviewed following labs and imaging studies  CBC:  Recent Labs Lab 12/26/16 1555 12/26/16 1612  WBC 8.2  --   NEUTROABS 5.6  --   HGB 14.5 14.6  HCT 42.2 43.0  MCV 87.0  --   PLT 154  --    Basic Metabolic Panel:  Recent Labs Lab 12/26/16 1555 12/26/16 1612  NA 138 139  K 3.6 3.5  CL 99* 98*  CO2 29  --   GLUCOSE 186* 189*  BUN 16 20  CREATININE 1.15 1.10  CALCIUM 9.7  --    GFR: Estimated Creatinine Clearance: 66.6 mL/min (by C-G formula based on SCr of 1.1 mg/dL). Liver Function Tests:  Recent Labs Lab 12/26/16 1555  AST 18  ALT 16*  ALKPHOS 79  BILITOT 0.7  PROT 7.3  ALBUMIN 4.3   No results for input(s): LIPASE, AMYLASE in the last 168 hours. No results for input(s): AMMONIA in the last 168 hours. Coagulation Profile:  Recent Labs Lab 12/26/16 1555  INR 1.05   Cardiac Enzymes: No results for input(s): CKTOTAL, CKMB, CKMBINDEX, TROPONINI in the last 168 hours. BNP (last 3 results) No results for input(s): PROBNP in the last 8760 hours. HbA1C: No results for input(s): HGBA1C in the last 72 hours. CBG:  Recent Labs Lab 12/26/16 1636  GLUCAP 173*   Lipid Profile: No results for input(s): CHOL, HDL, LDLCALC, TRIG, CHOLHDL, LDLDIRECT in the last 72 hours. Thyroid Function Tests: No results for input(s): TSH, T4TOTAL, FREET4, T3FREE,  THYROIDAB in the last 72 hours. Anemia Panel: No results for input(s): VITAMINB12, FOLATE, FERRITIN, TIBC, IRON, RETICCTPCT in the last 72 hours. Urine analysis:    Component Value Date/Time   COLORURINE YELLOW 12/26/2016 1640   APPEARANCEUR CLEAR 12/26/2016 1640   LABSPEC 1.024 12/26/2016 1640   PHURINE 7.0 12/26/2016 1640   GLUCOSEU 50 (A) 12/26/2016 1640   HGBUR NEGATIVE 12/26/2016 1640   BILIRUBINUR NEGATIVE 12/26/2016 1640   KETONESUR NEGATIVE 12/26/2016 1640   PROTEINUR NEGATIVE 12/26/2016 1640   NITRITE NEGATIVE 12/26/2016 1640   LEUKOCYTESUR NEGATIVE 12/26/2016 1640    Radiological Exams on Admission: Ct Angio Head W Or Wo Contrast  Result Date: 12/26/2016 CLINICAL DATA:  Code stroke, dizziness and unsteady gait EXAM: CT ANGIOGRAPHY HEAD AND NECK TECHNIQUE: Multidetector CT imaging of the head and neck was performed using the standard protocol during bolus administration of intravenous contrast. Multiplanar CT image reconstructions and MIPs were obtained to evaluate the vascular anatomy. Carotid stenosis measurements (when applicable) are obtained utilizing NASCET criteria, using the distal internal carotid diameter as the denominator. CONTRAST:  50 mL Isovue 370 IV COMPARISON:  CT head 12/26/2016 FINDINGS: CTA NECK FINDINGS Aortic arch: Minimal atherosclerotic disease aortic arch. Left vertebral artery origin from the aortic arch. Proximal great vessels patent Right carotid system: Mild atherosclerotic disease at the right carotid bifurcation without significant stenosis. Left carotid system: Normal Vertebral arteries: Both vertebral arteries patent to the basilar without significant stenosis. Skeleton: Extensive disc and facet degeneration throughout the cervical spine with multilevel spinal and foraminal stenosis. Extensive facet degeneration. Other neck: Negative for mass or adenopathy Upper chest: 8 mm right upper lobe nodule. Remainder of the upper lobes are clear Review of the MIP  images confirms the above findings CTA HEAD FINDINGS Anterior circulation: Mild atherosclerotic calcification in the cavernous carotid bilaterally without significant stenosis. Anterior and middle cerebral arteries patent bilaterally without significant stenosis. 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 Mild stenosis distal right vertebral artery due to calcified plaque. PICA patent bilaterally. Basilar patent. Superior cerebellar and posterior cerebral arteries patent bilaterally. Venous sinuses: Patent Anatomic variants: None Delayed phase: Not performed Review of the MIP images confirms the above findings IMPRESSION: No significant carotid or vertebral artery stenosis. No emergent  large vessel occlusion. 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 8 mm right upper lobe nodule. Recommend elective chest CT for further evaluation. These results were called by telephone at the time of interpretation on 12/26/2016 at 4:45 pm to Dr. Jonna Munro , who verbally acknowledged these results. Electronically Signed   By: Marlan Palau M.D.   On: 12/26/2016 16:46   Ct Angio Neck W Or Wo Contrast  Result Date: 12/26/2016 CLINICAL DATA:  Code stroke, dizziness and unsteady gait EXAM: CT ANGIOGRAPHY HEAD AND NECK TECHNIQUE: Multidetector CT imaging of the head and neck was performed using the standard protocol during bolus administration of intravenous contrast. Multiplanar CT image reconstructions and MIPs were obtained to evaluate the vascular anatomy. Carotid stenosis measurements (when applicable) are obtained utilizing NASCET criteria, using the distal internal carotid diameter as the denominator. CONTRAST:  50 mL Isovue 370 IV COMPARISON:  CT head 12/26/2016 FINDINGS: CTA NECK FINDINGS Aortic arch: Minimal atherosclerotic disease aortic arch. Left vertebral artery origin from the aortic arch. Proximal great  vessels patent Right carotid system: Mild atherosclerotic disease at the right carotid bifurcation without significant stenosis. Left carotid system: Normal Vertebral arteries: Both vertebral arteries patent to the basilar without significant stenosis. Skeleton: Extensive disc and facet degeneration throughout the cervical spine with multilevel spinal and foraminal stenosis. Extensive facet degeneration. Other neck: Negative for mass or adenopathy Upper chest: 8 mm right upper lobe nodule. Remainder of the upper lobes are clear Review of the MIP images confirms the above findings CTA HEAD FINDINGS Anterior circulation: Mild atherosclerotic calcification in the cavernous carotid bilaterally without significant stenosis. Anterior and middle cerebral arteries patent bilaterally without significant stenosis. 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 Mild stenosis distal right vertebral artery due to calcified plaque. PICA patent bilaterally. Basilar patent. Superior cerebellar and posterior cerebral arteries patent bilaterally. Venous sinuses: Patent Anatomic variants: None Delayed phase: Not performed Review of the MIP images confirms the above findings IMPRESSION: No significant carotid or vertebral artery stenosis. No emergent  large vessel occlusion. 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 8 mm right upper lobe nodule. Recommend elective chest CT for further evaluation. These results were called by telephone at the time of interpretation on 12/26/2016 at 4:45 pm to Dr. Jonna Munro , who verbally acknowledged these results. Electronically Signed   By: Marlan Palau M.D.   On: 12/26/2016 16:46   Ct Head Code Stroke W/o Cm  Addendum Date: 12/26/2016   ADDENDUM REPORT: 12/26/2016 16:47 ADDENDUM: These results were called by telephone at the time of interpretation on 12/26/2016 at 4:35 pm to Dr. Lavonna Monarch,  who verbally acknowledged these results. Electronically Signed   By: Marlan Palau M.D.   On: 12/26/2016 16:47   Result Date: 12/26/2016 CLINICAL DATA:  Code stroke.  Dizziness and unsteady gait EXAM: CT HEAD WITHOUT CONTRAST TECHNIQUE: Contiguous axial images were obtained from the base of the skull through the vertex without intravenous contrast. COMPARISON:  None. FINDINGS: Brain: Mild atrophy. Patchy hypodensity in the cerebral white matter bilaterally with a chronic appearance. No acute cortical infarct. Negative for hemorrhage or mass Vascular: Atherosclerotic calcification. Negative for hyperdense vessel Skull: Negative Sinuses/Orbits: Negative Other: None ASPECTS (Alberta Stroke Program Early CT Score) - Ganglionic level infarction (caudate, lentiform nuclei, internal capsule, insula, M1-M3 cortex): 7 - Supraganglionic infarction (M4-M6 cortex): 3 Total score (0-10 with 10 being normal): 10 IMPRESSION: 1. Negative for  acute infarct. 2. ASPECTS is 10 3. Atrophy and mild chronic white matter changes. Electronically Signed: By: Marlan Palau M.D. On: 12/26/2016 16:36    EKG: Independently reviewed. Normal sinus rhythm  Assessment/Plan Principal Problem:   Dizziness Active Problems:   Benign essential HTN   Controlled type 2 diabetes mellitus without complication, without long-term current use of insulin (HCC)   GERD (gastroesophageal reflux disease)    #1 dizziness/probable vertigo Patient presenting with sudden onset dizziness which he describes as a spinning sensation with a feeling of building spinning significantly around him like a hurricane. Patient with no focal neurological deficits. CT head negative. Admit to telemetry. Check MRI of the head to rule out a posterior circulatory stroke. Check orthostatics. Urinalysis is unremarkable. Patient afebrile. Normal white count. Doubt infectious etiology. Patient denies any chest pain. EKG with normal sinus rhythm. If MRI is positive will  undergo full stroke workup. PT vestibular evaluation. Place on scheduled meclizine 25 mg 3 times a day. Gentle hydration. Supportive care. Neurology following and appreciate input and recommendations.  #2 hypertension Will hold patient's ARB and diuretic. Continue home regimen of Norvasc.  #3 diabetes mellitus type 2 Check a hemoglobin A1c. Place on sliding scale insulin. Hold oral hypoglycemic agents.  #4 gastroesophageal reflux disease PPI.   DVT prophylaxis: Lovenox Code Status: Full Family Communication: Updated patient and family at bedside. Disposition Plan: Home when dizziness has resolved and clinical improvement, pending PT evaluation and full workup. Consults called: Neurology Admission status: Placed in observation.   [facility] MD [facility] Pager [phone]  If 7PM-7AM, please contact night-coverage [email or web address] Password [email or web address]  12/26/2016, 6:14 PM

## 2016-12-27 ENCOUNTER — Observation Stay (HOSPITAL_COMMUNITY): Payer: Medicare Other

## 2016-12-27 ENCOUNTER — Observation Stay (HOSPITAL_BASED_OUTPATIENT_CLINIC_OR_DEPARTMENT_OTHER): Payer: Medicare Other

## 2016-12-27 DIAGNOSIS — R42 Dizziness and giddiness: Secondary | ICD-10-CM

## 2016-12-27 DIAGNOSIS — R2681 Unsteadiness on feet: Secondary | ICD-10-CM | POA: Diagnosis present

## 2016-12-27 DIAGNOSIS — I639 Cerebral infarction, unspecified: Secondary | ICD-10-CM | POA: Diagnosis present

## 2016-12-27 DIAGNOSIS — K219 Gastro-esophageal reflux disease without esophagitis: Secondary | ICD-10-CM | POA: Diagnosis present

## 2016-12-27 DIAGNOSIS — I671 Cerebral aneurysm, nonruptured: Secondary | ICD-10-CM | POA: Diagnosis present

## 2016-12-27 DIAGNOSIS — I6300 Cerebral infarction due to thrombosis of unspecified precerebral artery: Secondary | ICD-10-CM | POA: Diagnosis not present

## 2016-12-27 DIAGNOSIS — H8110 Benign paroxysmal vertigo, unspecified ear: Secondary | ICD-10-CM | POA: Diagnosis present

## 2016-12-27 DIAGNOSIS — E119 Type 2 diabetes mellitus without complications: Secondary | ICD-10-CM | POA: Diagnosis present

## 2016-12-27 DIAGNOSIS — I6789 Other cerebrovascular disease: Secondary | ICD-10-CM | POA: Diagnosis not present

## 2016-12-27 DIAGNOSIS — Z794 Long term (current) use of insulin: Secondary | ICD-10-CM | POA: Diagnosis not present

## 2016-12-27 DIAGNOSIS — I10 Essential (primary) hypertension: Secondary | ICD-10-CM | POA: Diagnosis present

## 2016-12-27 DIAGNOSIS — Z7982 Long term (current) use of aspirin: Secondary | ICD-10-CM | POA: Diagnosis not present

## 2016-12-27 LAB — GLUCOSE, CAPILLARY
Glucose-Capillary: 122 mg/dL — ABNORMAL HIGH (ref 65–99)
Glucose-Capillary: 169 mg/dL — ABNORMAL HIGH (ref 65–99)
Glucose-Capillary: 229 mg/dL — ABNORMAL HIGH (ref 65–99)
Glucose-Capillary: 305 mg/dL — ABNORMAL HIGH (ref 65–99)
Glucose-Capillary: 127 mg/dL — ABNORMAL HIGH (ref 65–99)

## 2016-12-27 LAB — VAS US CAROTID
LEFT VERTEBRAL DIAS: -13 cm/s
Left CCA dist dias: 11 cm/s
Left CCA dist sys: 87 cm/s
Left CCA prox dias: 12 cm/s
Left CCA prox sys: 101 cm/s
Left ICA dist dias: -17 cm/s
Left ICA dist sys: -80 cm/s
Left ICA prox dias: -20 cm/s
Left ICA prox sys: -73 cm/s
RIGHT VERTEBRAL DIAS: -9 cm/s
Right CCA prox dias: -15 cm/s
Right CCA prox sys: -126 cm/s
Right cca dist sys: -100 cm/s

## 2016-12-27 LAB — BASIC METABOLIC PANEL
Anion gap: 10 (ref 5–15)
BUN: 12 mg/dL (ref 6–20)
CO2: 29 mmol/L (ref 22–32)
Calcium: 9.4 mg/dL (ref 8.9–10.3)
Chloride: 100 mmol/L — ABNORMAL LOW (ref 101–111)
Creatinine, Ser: 1.08 mg/dL (ref 0.61–1.24)
GFR calc Af Amer: >60 mL/min (ref >60)
GFR calc non Af Amer: >60 mL/min (ref >60)
Glucose, Bld: 148 mg/dL — ABNORMAL HIGH (ref 65–99)
Potassium: 3.2 mmol/L — ABNORMAL LOW (ref 3.5–5.1)
Sodium: 139 mmol/L (ref 135–145)

## 2016-12-27 LAB — CBC
HCT: 40.2 % (ref 39.0–52.0)
Hemoglobin: 13.8 g/dL (ref 13.0–17.0)
MCH: 29.7 pg (ref 26.0–34.0)
MCHC: 34.3 g/dL (ref 30.0–36.0)
MCV: 86.5 fL (ref 78.0–100.0)
Platelets: 146 10*3/uL — ABNORMAL LOW (ref 150–400)
RBC: 4.65 MIL/uL (ref 4.22–5.81)
RDW: 13 % (ref 11.5–15.5)
WBC: 9.3 10*3/uL (ref 4.0–10.5)

## 2016-12-27 LAB — TROPONIN I: Troponin I: <0.03 ng/mL (ref <0.03)

## 2016-12-27 MED ORDER — POTASSIUM CHLORIDE CRYS ER 20 MEQ PO TBCR
40.0000 meq | EXTENDED_RELEASE_TABLET | ORAL | Status: AC
  Administered 2016-12-27 (×2): 40 meq via ORAL
  Filled 2016-12-27 (×2): qty 2

## 2016-12-27 MED ORDER — ACETAMINOPHEN 325 MG PO TABS: 650.0000 mg | ORAL_TABLET | ORAL | Status: DC | PRN

## 2016-12-27 MED ORDER — ACETAMINOPHEN 650 MG RE SUPP: 650.0000 mg | RECTAL | Status: DC | PRN

## 2016-12-27 MED ORDER — ASPIRIN EC 325 MG PO TBEC
325.0000 mg | DELAYED_RELEASE_TABLET | Freq: Every day | ORAL | Status: DC
  Administered 2016-12-27 – 2016-12-28 (×2): 325 mg via ORAL
  Filled 2016-12-27 (×2): qty 1

## 2016-12-27 MED ORDER — MAGNESIUM SULFATE 4 GM/100ML IV SOLN
4.0000 g | Freq: Once | INTRAVENOUS | Status: AC
  Administered 2016-12-27: 4 g via INTRAVENOUS
  Filled 2016-12-27: qty 100

## 2016-12-27 MED ORDER — ACETAMINOPHEN 160 MG/5ML PO SOLN: 650.0000 mg | ORAL | Status: DC | PRN

## 2016-12-27 MED ORDER — MECLIZINE HCL 25 MG PO TABS: 25.0000 mg | ORAL_TABLET | Freq: Three times a day (TID) | ORAL | Status: DC | PRN

## 2016-12-27 MED ORDER — STROKE: EARLY STAGES OF RECOVERY BOOK
Freq: Once | Status: AC
  Administered 2016-12-27: 08:00:00
  Filled 2016-12-27: qty 1

## 2016-12-27 MED ORDER — SENNOSIDES-DOCUSATE SODIUM 8.6-50 MG PO TABS: 1.0000 | ORAL_TABLET | Freq: Every evening | ORAL | Status: DC | PRN

## 2016-12-27 MED ORDER — OMEGA-3-ACID ETHYL ESTERS 1 G PO CAPS
1.0000 g | ORAL_CAPSULE | Freq: Every day | ORAL | Status: DC
  Administered 2016-12-27 – 2016-12-28 (×2): 1 g via ORAL
  Filled 2016-12-27 (×2): qty 1

## 2016-12-27 MED ORDER — LORAZEPAM 2 MG/ML IJ SOLN: 0.5000 mg | Freq: Once | INTRAMUSCULAR | Status: DC

## 2016-12-27 NOTE — Evaluation (Signed)
Physical Therapy Evaluation Patient Details Name: [name] MRN: [id number] DOB: [birth year] Today's Date: 12/27/2016   History of Present Illness  76 yo male admitted withdizziness with blurred vision. CT(+) L posterior commuicating artery aneurysm, 8 mm R upper lobe nodule PMH: HTN DM back surg, BIL rotator cuff surg   Clinical Impression  Pt is close to baseline functioning with some lightheadedness and some L eye nystagmus seen on OT eval, but not noted on PT eval.  Pt should be safe at home with wife/families assist/supervision. There are no further acute PT needs.  Believe pt culd benefit from OPPT if his dysequilibrium continues.  Will sign off at this time.     Follow Up Recommendations Outpatient PT;Other (comment) (if symptoms continue)    Equipment Recommendations  None recommended by PT    Recommendations for Other Services       Precautions / Restrictions Precautions Precautions: Fall      Mobility  Bed Mobility Overal bed mobility: Needs Assistance Bed Mobility: Supine to Sit     Supine to sit: Min guard;Min assist     General bed mobility comments: pt needed cues to sequence task and to progress to EOB. Pt reports mobility is around normal but he is a little weaker.  Transfers Overall transfer level: Needs assistance Equipment used: None Transfers: Sit to/from Stand Sit to Stand: Min guard         General transfer comment: slow with some struggle, no rails, no assist  Ambulation/Gait Ambulation/Gait assistance: Min guard Ambulation Distance (Feet): 20 Feet Assistive device: None Gait Pattern/deviations: Step-through pattern     General Gait Details: generally steady, but still tentative.  pt stating light-headed, but not dizzy or with vertigo.  Stairs            Wheelchair Mobility    Modified Rankin (Stroke Patients Only)       Balance Overall balance assessment: Needs assistance Sitting-balance support: Bilateral upper  extremity supported;Feet supported Sitting balance-Leahy Scale: Fair     Standing balance support: During functional activity;Single extremity supported Standing balance-Leahy Scale: Fair Standing balance comment: stood at toilet standing to urinate without assist or stationary object ot hold to.                             Pertinent Vitals/Pain Pain Assessment: No/denies pain    Home Living Family/patient expects to be discharged to:: Private residence Living Arrangements: Spouse/significant other Available Help at Discharge: Family Type of Home: House Home Access: Stairs to enter Entrance Stairs-Rails: Psychiatric nurse of Steps: 2 Home Layout: One level Home Equipment: None Additional Comments: pt normally gardens this time of the year and raises around 600 plants. Pt anxious to get home and start working on getting plants in the ground.     Prior Function Level of Independence: Independent               Hand Dominance   Dominant Hand: Right    Extremity/Trunk Assessment   Upper Extremity Assessment Upper Extremity Assessment: Overall WFL for tasks assessed    Lower Extremity Assessment Lower Extremity Assessment: Overall WFL for tasks assessed    Cervical / Trunk Assessment Cervical / Trunk Assessment:  (stiff neck, crepitus)  Communication   Communication: No difficulties  Cognition Arousal/Alertness: Awake/alert Behavior During Therapy: WFL for tasks assessed/performed Overall Cognitive Status: Within Functional Limits for tasks assessed  General Comments General comments (skin integrity, edema, etc.): Basic vestibular assessment conducted.  pt's answers to quentions did NOT sound like BPPV, but order asked to rule out vestibular causes for vertigo.  Completed Hall-pike Dix and supine head roll for both right and lift sides without signs of nystagmus or overt vertigo.   General rolling and bed mobility did not produce any vertiginous s/s.  pt reported some light headedness only.  Discussed compensations to help with any dysequilibrium in the short-term.    Exercises     Assessment/Plan    PT Assessment All further PT needs can be met in the next venue of care  PT Problem List         PT Treatment Interventions      PT Goals (Current goals can be found in the Care Plan section)  Acute Rehab PT Goals Patient Stated Goal: to get garden planted PT Goal Formulation: With patient    Frequency     Barriers to discharge        Co-evaluation               End of Session   Activity Tolerance: Patient tolerated treatment well Patient left: in bed;with call bell/phone within reach;with family/visitor present Nurse Communication: Mobility status PT Visit Diagnosis: Unsteadiness on feet (R26.81);Dizziness and giddiness (R42)    Time: 4707-6151 PT Time Calculation (min) (ACUTE ONLY): 26 min   Charges:   PT Evaluation $PT Eval Moderate Complexity: 1 Procedure PT Treatments $Neuromuscular Re-education: 8-22 mins   PT G Codes:   PT G-Codes **NOT FOR INPATIENT CLASS** Functional Assessment Tool Used: AM-PAC 6 Clicks Basic Mobility;Clinical judgement Functional Limitation: Mobility: Walking and moving around Mobility: Walking and Moving Around Current Status ([id number]): At least 1 percent but less than 20 percent impaired, limited or restricted Mobility: Walking and Moving Around Goal Status [phone]): At least 1 percent but less than 20 percent impaired, limited or restricted Mobility: Walking and Moving Around Discharge Status [phone]): At least 1 percent but less than 20 percent impaired, limited or restricted    12/27/2016  Donnella Sham, PT [phone]  (pager)  [name] 12/27/2016, 12:04 PM

## 2016-12-27 NOTE — Evaluation (Signed)
Speech Language Pathology Evaluation Patient Details Name: [name] MRN: [id number] DOB: August 07, 1941 Today's Date: 12/27/2016 Time: 4098-1191 SLP Time Calculation (min) (ACUTE ONLY): 20 min  Problem List:  Patient Active Problem List   Diagnosis Date Noted  . CVA (cerebral vascular accident) (HCC) 12/27/2016  . Dizziness 12/26/2016  . GERD (gastroesophageal reflux disease) 12/26/2016  . Unsteady gait   . Benign essential HTN 10/11/2015  . Leukocytosis 10/11/2015  . ARF (acute renal failure) (HCC) 10/11/2015  . Hypokalemia 10/11/2015  . Controlled type 2 diabetes mellitus without complication, without long-term current use of insulin (HCC) 10/11/2015  . Sepsis secondary to UTI (HCC) 10/11/2015   Past Medical History:  Past Medical History:  Diagnosis Date  . Diabetes mellitus without complication (HCC)   . GERD (gastroesophageal reflux disease) 12/26/2016  . Hypertension    Past Surgical History:  Past Surgical History:  Procedure Laterality Date  . BACK SURGERY    . COLONOSCOPY WITH PROPOFOL N/A 11/18/2014   Procedure: COLONOSCOPY WITH PROPOFOL;  Surgeon: Charolett Bumpers, MD;  Location: [facility];  Service: Endoscopy;  Laterality: N/A;  . ROTATOR CUFF REPAIR     bilateral shoulder  . THROAT SURGERY     benign tumor removed   HPI:  76 yo male admitted withdizziness with blurred vision. CT(+) L posterior commuicating artery aneurysm, 8 mm R upper lobe nodule PMH: HTN DM back surg, BIL rotator cuff surg    Assessment / Plan / Recommendation Clinical Impression  Pt presents with normal speech/language, fluent output, is oriented with functional memory and problem solving.  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  No SLP f/u is recommended at this time.  Family/pt agree. Agree that pt may benefit from OP OT services.     SLP Assessment  SLP Recommendation/Assessment: Patient does not need any further Speech Lanaguage Pathology Services SLP  Visit Diagnosis: Cognitive communication deficit (R41.841)    Follow Up Recommendations  None    Frequency and Duration           SLP Evaluation Cognition  Overall Cognitive Status: Impaired/Different from baseline Orientation Level: Oriented X4 Attention: Selective Selective Attention: Appears intact Memory: Appears intact Problem Solving: Appears intact       Comprehension  Auditory Comprehension Overall Auditory Comprehension: Appears within functional limits for tasks assessed Visual Recognition/Discrimination Discrimination: Within Function Limits Reading Comprehension Reading Status: Within funtional limits    Expression Expression Primary Mode of Expression: Verbal Verbal Expression Overall Verbal Expression: Appears within functional limits for tasks assessed Written Expression Dominant Hand: Right Written Expression: Within Functional Limits   Oral / Motor  Oral Motor/Sensory Function Overall Oral Motor/Sensory Function: Within functional limits Motor Speech Overall Motor Speech: Appears within functional limits for tasks assessed   GO          Functional Assessment Tool Used: clinical judgmentq Functional Limitations: Memory Memory Current Status (Y7829): At least 1 percent but less than 20 percent impaired, limited or restricted Memory Goal Status (F6213): At least 1 percent but less than 20 percent impaired, limited or restricted Memory Discharge Status [phone]): At least 1 percent but less than 20 percent impaired, limited or restricted         Blenda Mounts Laurice 12/27/2016, 12:20 PM

## 2016-12-27 NOTE — Care Management Note (Signed)
Case Management Note  Patient Details  Name: Mitesh Rosendahl MRN: [id number] Date of Birth: 1941/03/27  Subjective/Objective:   Pt in with dizziness. He is from home with his spouse.                  Action/Plan: OT recommending outpatient therapy. Awaiting PT rec. CM following.   Expected Discharge Date:  12/30/16               Expected Discharge Plan:  Home/Self Care  In-House Referral:     Discharge planning Services     Post Acute Care Choice:    Choice offered to:     DME Arranged:    DME Agency:     HH Arranged:    HH Agency:     Status of Service:  In process, will continue to follow  If discussed at Long Length of Stay Meetings, dates discussed:    Additional Comments:  Kermit Balo, RN 12/27/2016, 11:47 AM

## 2016-12-27 NOTE — Consult Note (Signed)
NEURO HOSPITALIST CONSULT NOTE   Requestig physician: Dr. Janee Morn   Reason for Consult: Vertigo   History obtained from:  Patient    HPI:                                                                                                                                          [name] is an 76 y.o. male who has stroke risk factors of hypertension, diabetes. Patient was brought to the emergency department yesterday after he noted that after leaning over and standing up. Sudden severe leftward vertigo. Patient stood up and started walk toward the chair noted that he is appearing to the left. He finally got to the chair sat down but noted that his surroundings continue to move from right to left. EMS was called and patient continued to have vertigo throughout the whole ED visit into the night. Patient was then given meclizine which did improve his symptoms. Patient does endorse the fact that if he remains still the vertigo or vertiginous sensation did stop. Today he is on meclizine and does not know any vertiginous sensations. He has no other complaints.  I did do a Dix-Hallpike maneuver on him and I did notice that he had rotary nystagmus especially his head was turned to the right. Not so much when his head was turned to the left. Patient did not show any dysmetria or ataxia when walking and with finger to nose.  Condition did receive a MRI with and without contrast will emergency room and was read as showing a subcentimeter right temporal lobe subacute infarct with laminar necrosis and slight enhancement. This infarct would not have any correlation with the symptoms that the patient was experiencing. 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 Again neurology was consulted for this also.  Past Medical History:  Diagnosis Date  . Diabetes mellitus without complication (HCC)   . GERD (gastroesophageal reflux disease) 12/26/2016  .  Hypertension     Past Surgical History:  Procedure Laterality Date  . BACK SURGERY    . COLONOSCOPY WITH PROPOFOL N/A 11/18/2014   Procedure: COLONOSCOPY WITH PROPOFOL;  Surgeon: Charolett Bumpers, MD;  Location: [facility];  Service: Endoscopy;  Laterality: N/A;  . ROTATOR CUFF REPAIR     bilateral shoulder  . THROAT SURGERY     benign tumor removed    Family History  Problem Relation Age of Onset  . Heart failure Mother   . Dementia Mother      Social History:  reports that he has never smoked. He has never used smokeless tobacco. He reports that he does not drink alcohol or use drugs.  No Known Allergies  MEDICATIONS:  Scheduled: .  stroke: mapping our early stages of recovery book   Does not apply Once  . ALPRAZolam  0.25 mg Oral QHS  . amLODipine  10 mg Oral Daily  . aspirin EC  325 mg Oral Daily  . enoxaparin (LOVENOX) injection  40 mg Subcutaneous Q24H  . insulin aspart  0-15 Units Subcutaneous TID WC  . LORazepam  0.5 mg Intravenous Once  . multivitamin with minerals  1 tablet Oral Daily  . omega-3 acid ethyl esters  1 g Oral Daily  . pantoprazole  40 mg Oral Daily  . potassium chloride  40 mEq Oral Q4H  . sodium chloride flush  3 mL Intravenous Q12H     ROS:                                                                                                                                       History obtained from the patient  General ROS: negative for - chills, fatigue, fever, night sweats, weight gain or weight loss Psychological ROS: negative for - behavioral disorder, hallucinations, memory difficulties, mood swings or suicidal ideation Ophthalmic ROS: negative for - blurry vision, double vision, eye pain or loss of vision ENT ROS: negative for - epistaxis, nasal discharge, oral lesions, sore throat, tinnitus or vertigo Allergy and Immunology  ROS: negative for - hives or itchy/watery eyes Hematological and Lymphatic ROS: negative for - bleeding problems, bruising or swollen lymph nodes Endocrine ROS: negative for - galactorrhea, hair pattern changes, polydipsia/polyuria or temperature intolerance Respiratory ROS: negative for - cough, hemoptysis, shortness of breath or wheezing Cardiovascular ROS: negative for - chest pain, dyspnea on exertion, edema or irregular heartbeat Gastrointestinal ROS: negative for - abdominal pain, diarrhea, hematemesis, nausea/vomiting or stool incontinence Genito-Urinary ROS: negative for - dysuria, hematuria, incontinence or urinary frequency/urgency Musculoskeletal ROS: negative for - joint swelling or muscular weakness Neurological ROS: as noted in HPI Dermatological ROS: negative for rash and skin lesion changes   Blood pressure 139/63, pulse 80, temperature 97.9 F (36.6 C), temperature source Oral, resp. rate 18, height 6\' 1"  (1.854 m), weight 85.5 kg (188 lb 8 oz), SpO2 95 %.   Neurologic Examination:                                                                                                      HEENT-  Normocephalic, no lesions, without obvious abnormality.  Normal external eye and conjunctiva.  Normal TM's bilaterally.  Normal auditory canals and external ears.  Normal external nose, mucus membranes and septum.  Normal pharynx. Cardiovascular- S1, S2 normal, pulses palpable throughout   Lungs- chest clear, no wheezing, rales, normal symmetric air entry Abdomen- normal findings: bowel sounds normal Extremities- no edema Lymph-no adenopathy palpable Musculoskeletal-no joint tenderness, deformity or swelling Skin-warm and dry, no hyperpigmentation, vitiligo, or suspicious lesions  Neurological Examination Mental Status: Alert, oriented, thought content appropriate.  Speech fluent without evidence of aphasia.  Able to follow 3 step commands without difficulty. Cranial Nerves: II: Discs  flat bilaterally; Visual fields grossly normal, pupils equal, round, reactive to light and accommodation III,IV, VI: ptosis not present, extra-ocular motions intact bilaterally V,VII: smile symmetric, facial light touch sensation normal bilaterally VIII: hearing normal bilaterally IX,X: uvula rises symmetrically XI: bilateral shoulder shrug XII: midline tongue extension Motor: Right : Upper extremity   5/5    Left:     Upper extremity   5/5  Lower extremity   5/5     Lower extremity   5/5 Tone and bulk:normal tone throughout; no atrophy noted Sensory: Pinprick and light touch intact throughout, bilaterally Deep Tendon Reflexes: 2+ and symmetric throughout Plantars: Right: downgoing   Left: downgoing Cerebellar: normal finger-to-nose, normal rapid alternating movements and normal heel-to-shin test Gait: normal gait and station   --As stated with the Dix-Hallpike maneuver patient did have rotary nystagmus when the head was turned to the right or so than the left. Patient did not have any vertigo when looking around the room or standing up.   Lab Results: Basic Metabolic Panel:  Recent Labs Lab 12/26/16 1555 12/26/16 1612 12/26/16 2203 12/27/16 0544  NA 138 139  --  139  K 3.6 3.5  --  3.2*  CL 99* 98*  --  100*  CO2 29  --   --  29  GLUCOSE 186* 189*  --  148*  BUN 16 20  --  12  CREATININE 1.15 1.10  --  1.08  CALCIUM 9.7  --   --  9.4  MG  --   --  1.7  --     Liver Function Tests:  Recent Labs Lab 12/26/16 1555  AST 18  ALT 16*  ALKPHOS 79  BILITOT 0.7  PROT 7.3  ALBUMIN 4.3   No results for input(s): LIPASE, AMYLASE in the last 168 hours. No results for input(s): AMMONIA in the last 168 hours.  CBC:  Recent Labs Lab 12/26/16 1555 12/26/16 1612 12/27/16 0544  WBC 8.2  --  9.3  NEUTROABS 5.6  --   --   HGB 14.5 14.6 13.8  HCT 42.2 43.0 40.2  MCV 87.0  --  86.5  PLT 154  --  146*    Cardiac Enzymes:  Recent Labs Lab 12/27/16 0857  TROPONINI  <0.03    Lipid Panel: No results for input(s): CHOL, TRIG, HDL, CHOLHDL, VLDL, LDLCALC in the last 168 hours.  CBG:  Recent Labs Lab 12/26/16 1636 12/27/16 0637 12/27/16 0919  GLUCAP 173* 122* 169*    Microbiology: Results for orders placed or performed during the hospital encounter of 10/11/15  Urine C&S     Status: None   Collection Time: 10/11/15 11:13 AM  Result Value Ref Range Status   Specimen Description URINE, CLEAN CATCH  Final   Special Requests Normal  Final   Culture   Final    >=100,000 COLONIES/mL KLEBSIELLA OXYTOCA Performed at [facility]    Report Status 10/13/2015 FINAL  Final   Organism ID, Bacteria KLEBSIELLA  OXYTOCA  Final      Susceptibility   Klebsiella oxytoca - MIC*    AMPICILLIN >=32 RESISTANT Resistant     CEFAZOLIN >=64 RESISTANT Resistant     CEFTRIAXONE <=1 SENSITIVE Sensitive     CIPROFLOXACIN <=0.25 SENSITIVE Sensitive     GENTAMICIN <=1 SENSITIVE Sensitive     IMIPENEM <=0.25 SENSITIVE Sensitive     NITROFURANTOIN <=16 SENSITIVE Sensitive     TRIMETH/SULFA <=20 SENSITIVE Sensitive     AMPICILLIN/SULBACTAM 16 INTERMEDIATE Intermediate     PIP/TAZO <=4 SENSITIVE Sensitive     * >=100,000 COLONIES/mL KLEBSIELLA OXYTOCA  Culture, blood (routine x 2)     Status: None   Collection Time: 10/11/15  5:10 PM  Result Value Ref Range Status   Specimen Description BLOOD LEFT ANTECUBITAL  Final   Special Requests BOTTLES DRAWN AEROBIC AND ANAEROBIC 10CC  Final   Culture   Final    NO GROWTH 5 DAYS Performed at [facility]    Report Status 10/16/2015 FINAL  Final  Culture, blood (routine x 2)     Status: None   Collection Time: 10/11/15  5:18 PM  Result Value Ref Range Status   Specimen Description BLOOD LEFT HAND  Final   Special Requests BOTTLES DRAWN AEROBIC AND ANAEROBIC 10 CC EACH  Final   Culture   Final    NO GROWTH 5 DAYS Performed at [facility]    Report Status 10/16/2015 FINAL  Final    Coagulation  Studies:  Recent Labs  12/26/16 1555  LABPROT 13.7  INR 1.05    Imaging: Ct Angio Head W Or Wo Contrast  Result Date: 12/26/2016 CLINICAL DATA:  Code stroke, dizziness and unsteady gait EXAM: CT ANGIOGRAPHY HEAD AND NECK TECHNIQUE: Multidetector CT imaging of the head and neck was performed using the standard protocol during bolus administration of intravenous contrast. Multiplanar CT image reconstructions and MIPs were obtained to evaluate the vascular anatomy. Carotid stenosis measurements (when applicable) are obtained utilizing NASCET criteria, using the distal internal carotid diameter as the denominator. CONTRAST:  50 mL Isovue 370 IV COMPARISON:  CT head 12/26/2016 FINDINGS: CTA NECK FINDINGS Aortic arch: Minimal atherosclerotic disease aortic arch. Left vertebral artery origin from the aortic arch. Proximal great vessels patent Right carotid system: Mild atherosclerotic disease at the right carotid bifurcation without significant stenosis. Left carotid system: Normal Vertebral arteries: Both vertebral arteries patent to the basilar without significant stenosis. Skeleton: Extensive disc and facet degeneration throughout the cervical spine with multilevel spinal and foraminal stenosis. Extensive facet degeneration. Other neck: Negative for mass or adenopathy Upper chest: 8 mm right upper lobe nodule. Remainder of the upper lobes are clear Review of the MIP images confirms the above findings CTA HEAD FINDINGS Anterior circulation: Mild atherosclerotic calcification in the cavernous carotid bilaterally without significant stenosis. Anterior and middle cerebral arteries patent bilaterally without significant stenosis. 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 Mild stenosis distal right vertebral artery due to calcified plaque. PICA patent bilaterally. Basilar patent. Superior cerebellar and posterior cerebral arteries  patent bilaterally. Venous sinuses: Patent Anatomic variants: None Delayed phase: Not performed Review of the MIP images confirms the above findings IMPRESSION: No significant carotid or vertebral artery stenosis. No emergent  large vessel occlusion. 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 8 mm right upper lobe nodule. Recommend elective chest CT for further evaluation. These results were called by telephone at the  time of interpretation on 12/26/2016 at 4:45 pm to Dr. Jonna Munro , who verbally acknowledged these results. Electronically Signed   By: Marlan Palau M.D.   On: 12/26/2016 16:46   Ct Angio Neck W Or Wo Contrast  Result Date: 12/26/2016 CLINICAL DATA:  Code stroke, dizziness and unsteady gait EXAM: CT ANGIOGRAPHY HEAD AND NECK TECHNIQUE: Multidetector CT imaging of the head and neck was performed using the standard protocol during bolus administration of intravenous contrast. Multiplanar CT image reconstructions and MIPs were obtained to evaluate the vascular anatomy. Carotid stenosis measurements (when applicable) are obtained utilizing NASCET criteria, using the distal internal carotid diameter as the denominator. CONTRAST:  50 mL Isovue 370 IV COMPARISON:  CT head 12/26/2016 FINDINGS: CTA NECK FINDINGS Aortic arch: Minimal atherosclerotic disease aortic arch. Left vertebral artery origin from the aortic arch. Proximal great vessels patent Right carotid system: Mild atherosclerotic disease at the right carotid bifurcation without significant stenosis. Left carotid system: Normal Vertebral arteries: Both vertebral arteries patent to the basilar without significant stenosis. Skeleton: Extensive disc and facet degeneration throughout the cervical spine with multilevel spinal and foraminal stenosis. Extensive facet degeneration. Other neck: Negative for mass or adenopathy Upper chest: 8 mm right upper lobe nodule. Remainder of the upper lobes are clear Review of the  MIP images confirms the above findings CTA HEAD FINDINGS Anterior circulation: Mild atherosclerotic calcification in the cavernous carotid bilaterally without significant stenosis. Anterior and middle cerebral arteries patent bilaterally without significant stenosis. 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 Mild stenosis distal right vertebral artery due to calcified plaque. PICA patent bilaterally. Basilar patent. Superior cerebellar and posterior cerebral arteries patent bilaterally. Venous sinuses: Patent Anatomic variants: None Delayed phase: Not performed Review of the MIP images confirms the above findings IMPRESSION: No significant carotid or vertebral artery stenosis. No emergent  large vessel occlusion. 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 8 mm right upper lobe nodule. Recommend elective chest CT for further evaluation. These results were called by telephone at the time of interpretation on 12/26/2016 at 4:45 pm to Dr. Jonna Munro , who verbally acknowledged these results. Electronically Signed   By: Marlan Palau M.D.   On: 12/26/2016 16:46   Mr Laqueta Jean ZO Contrast  Result Date: 12/26/2016 CLINICAL DATA:  Acute onset vertigo while working in garden. Gait abnormality. History of hypertension, diabetes. EXAM: MRI HEAD WITHOUT AND WITH CONTRAST TECHNIQUE: Multiplanar, multiecho pulse sequences of the brain and surrounding structures were obtained without and with intravenous contrast. CONTRAST:  18mL MULTIHANCE GADOBENATE DIMEGLUMINE 529 MG/ML IV SOLN COMPARISON:  CT HEAD December 26, 2016 at 1618 hours FINDINGS: BRAIN: No reduced diffusion to suggest acute ischemia. No susceptibility artifact to suggest hemorrhage. The ventricles and sulci are normal for patient's age. Curvilinear subcentimeter FLAIR T2 hyperintense signal RIGHT temporal gyrus with minimal T1 shortening, and slight  enhancement. Patchy confluent No suspicious parenchymal signal, masses or mass effect. No abnormal extra-axial fluid collections. VASCULAR: Normal major intracranial vascular flow voids present at skull base. SKULL AND UPPER CERVICAL SPINE: No abnormal sellar expansion. No suspicious calvarial bone marrow signal. Craniocervical junction maintained. SINUSES/ORBITS: Trace paranasal sinus mucosal thickening. Mastoid air cells are well aerated. The included ocular globes and orbital contents are non-suspicious. OTHER: None. IMPRESSION: No acute intracranial process. Subcentimeter RIGHT temporal lobe subacute infarct with laminar necrosis and slight enhancement. As a conservative measure, recommend follow-up MRI of the brain with contrast  in 6-8 weeks to verify improvement. Mild to moderate chronic small vessel ischemic disease. Electronically Signed   By: Awilda Metro M.D.   On: 12/26/2016 21:14   Ct Head Code Stroke W/o Cm  Addendum Date: 12/26/2016   ADDENDUM REPORT: 12/26/2016 16:47 ADDENDUM: These results were called by telephone at the time of interpretation on 12/26/2016 at 4:35 pm to Dr. Lavonna Monarch, who verbally acknowledged these results. Electronically Signed   By: Marlan Palau M.D.   On: 12/26/2016 16:47   Result Date: 12/26/2016 CLINICAL DATA:  Code stroke.  Dizziness and unsteady gait EXAM: CT HEAD WITHOUT CONTRAST TECHNIQUE: Contiguous axial images were obtained from the base of the skull through the vertex without intravenous contrast. COMPARISON:  None. FINDINGS: Brain: Mild atrophy. Patchy hypodensity in the cerebral white matter bilaterally with a chronic appearance. No acute cortical infarct. Negative for hemorrhage or mass Vascular: Atherosclerotic calcification. Negative for hyperdense vessel Skull: Negative Sinuses/Orbits: Negative Other: None ASPECTS (Alberta Stroke Program Early CT Score) - Ganglionic level infarction (caudate, lentiform nuclei, internal capsule, insula, M1-M3 cortex): 7 -  Supraganglionic infarction (M4-M6 cortex): 3 Total score (0-10 with 10 being normal): 10 IMPRESSION: 1. Negative for acute infarct. 2. ASPECTS is 10 3. Atrophy and mild chronic white matter changes. Electronically Signed: By: Marlan Palau M.D. On: 12/26/2016 16:36       Assessment and plan per attending neurologist  Felicie Morn PA-C [facility] [phone]  12/27/2016, 12:58 PM   Assessment/Plan:  This is a 76 year old male presenting to the hospital with new onset vertigo which has now resolved. Patient currently is on meclizine which is giving good resolution. MRI brain does show a possible right temporal lobe punctate infarct which would not have any correlation with patient's symptoms. Question artifact and would recommend repeat MRI in approximately 6 months. CTA also showed a 3 mm posterior communicating artery aneurysm. At this point would not do any intervention and would repeat CTA in 1 year to continue to follow to see if there is any growth.  This aneurysm did not have any symptomatic aspects of patient's symptoms. 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    Recommend: Echocardiogram A1c Fasting lipid panel Follow-up MRI and approximate 6 months along with CTA to evaluate aneurysm growth. This may be done with outpatient neurology. If echocardiogram is negative no further recommendations per neurology and neurology will sign off.

## 2016-12-27 NOTE — Progress Notes (Signed)
Preliminary results by tech - Carotid Duplex Completed. No evidence of stenosis noted in bilateral carotid arteries.  Gawain Crombie, BS, RDMS, RVT  

## 2016-12-27 NOTE — Care Management Obs Status (Signed)
MEDICARE OBSERVATION STATUS NOTIFICATION   Patient Details  Name: Nellie Chevalier MRN: [id number] Date of Birth: 06-25-1941   Medicare Observation Status Notification Given:  Yes    Kermit Balo, RN 12/27/2016, 1:02 PM

## 2016-12-27 NOTE — Progress Notes (Signed)
PROGRESS NOTE    [name]  ZOX:[id number] DOB: February 27, 1941 DOA: 12/26/2016 PCP: Lillia Mountain, MD   Brief Narrative:  Patient is a 76 year old gentleman history of hypertension, type 2 diabetes, GERD presenting to the ED with sudden onset of dizziness/spinning sensation while working in his garden. Patient's symptoms likely BPPV/new onset vertigo as patient noted to have some nystagmus when Dix-Hallpike maneuver done by neurology PA and also by OT. 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 MRI of the head with subcentimeter right temporal lobe subacute infarct with laminar necrosis and slight enhancement. Patient with some clinical improvement on meclizine. Neurology consulted.   Assessment & Plan:   Principal Problem:   Vertigo Active Problems:   Benign essential HTN   Controlled type 2 diabetes mellitus without complication, without long-term current use of insulin (HCC)   Dizziness   GERD (gastroesophageal reflux disease)   Unsteady gait   CVA (cerebral vascular accident) (HCC)  #1 new-onset vertigo Patient had presented with spinning sensation with sudden onset while working in the garden on the day of admission. On examination on admission while laying on gurney still patient did not have the spinning sensation however on sitting up and with movement patient did have a spinning sensation again noted. Patient placed on meclizine with clinical improvement. Neurology PA dated Dix-Hallpike maneuver and noticed a rotary nystagmus especially with her to turn to the right and not as much with her turn to the left. MRI of the head with and without contrast done showed a subcentimeter right temporal lobe subacute infarct with laminar necrosis and slight enhancement. Infarct noted on MRI likely not related to patient's symptoms. 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  Neurology was consulted and followed the patient during the hospitalization. Carotid Dopplers done with no significant ICA stenosis. 2-D echo pending. Hemoglobin A1c pending. Fasting lipid panel pending. PT/OT. Meclizine as needed. Follow.  #2 hypertension Stable. Continue Norvasc.  #3 gastroesophageal reflux disease PPI.  #4 subcentimeter right temporal lobe subacute infarct versus artifact/3 mm posterior communicating artery aneurysm on CT angiogram head and neck   noted on MRI of the brain. Patient undergo stroke workup. 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 Carotid Dopplers negative for ICA stenosis. 2-D echo pending. Hemoglobin A1c pending. Fasting lipid panel pending. Per neurology patient likely needs repeat follow-up MRI and CT angiogram of head and neck in 6 months to follow-up on MRI finding as well as evaluate aneurysmal growth.  #5 diabetes mellitus type 2 Hemoglobin A1c pending. Continue to hold oral hypoglycemic agents. Sliding scale insulin.    DVT prophylaxis: Lovenox Code Status: Full Family Communication: Updated patient and family at bedside. Disposition Plan: Home once stroke workup has been completed hopefully tomorrow.   Consultants:   Neurology: Dr.Dakakni 12/26/2016  Procedures:   CT angiogram head and neck 12/26/2016  Chest x-ray 12/27/2016  MRI head with and without contrast 12/26/2016  Carotid Dopplers 12/27/2016  Antimicrobials:   None   Subjective: Patient states improvement with spinning sensation after trial of meclizine. No chest pain. No shortness of breath. Patient noted to have some nystagmus seen on OT evaluation. Patient also noted to have rotary nystagmus on Dix-Hallpike maneuver per neurology PA with turning the head to the right.  Objective: Vitals:   12/27/16 0639 12/27/16 1000 12/27/16 1500 12/27/16 1759  BP:  139/63 Marland Kitchen)  147/66 (!) 130/54  Pulse:  80 83   Resp:  Temp:   97.9 F (36.6 C) 97.9 F (36.6 C) 97.7 F (36.5 C)  TempSrc:  Oral Oral Oral  SpO2:  95% 97% 98%  Weight: 85.5 kg (188 lb 8 oz)     Height:        Intake/Output Summary (Last 24 hours) at 12/27/16 1811 Last data filed at 12/27/16 0300  Gross per 24 hour  Intake              500 ml  Output              500 ml  Net                0 ml   Filed Weights   12/26/16 1542 12/27/16 0639  Weight: 84.4 kg (186 lb) 85.5 kg (188 lb 8 oz)    Examination:  General exam: Appears calm and comfortable  Respiratory system: Clear to auscultation. Respiratory effort normal. Cardiovascular system: S1 & S2 heard, RRR. No JVD, murmurs, rubs, gallops or clicks. No pedal edema. Gastrointestinal system: Abdomen is nondistended, soft and nontender. No organomegaly or masses felt. Normal bowel sounds heard. Central nervous system: Alert and oriented. No focal neurological deficits. Extremities: Symmetric 5 x 5 power. Skin: No rashes, lesions or ulcers Psychiatry: Judgement and insight appear normal. Mood & affect appropriate.     Data Reviewed: I have personally reviewed following labs and imaging studies  CBC:  Recent Labs Lab 12/26/16 1555 12/26/16 1612 12/27/16 0544  WBC 8.2  --  9.3  NEUTROABS 5.6  --   --   HGB 14.5 14.6 13.8  HCT 42.2 43.0 40.2  MCV 87.0  --  86.5  PLT 154  --  146*   Basic Metabolic Panel:  Recent Labs Lab 12/26/16 1555 12/26/16 1612 12/26/16 2203 12/27/16 0544  NA 138 139  --  139  K 3.6 3.5  --  3.2*  CL 99* 98*  --  100*  CO2 29  --   --  29  GLUCOSE 186* 189*  --  148*  BUN 16 20  --  12  CREATININE 1.15 1.10  --  1.08  CALCIUM 9.7  --   --  9.4  MG  --   --  1.7  --    GFR: Estimated Creatinine Clearance: 67.8 mL/min (by C-G formula based on SCr of 1.08 mg/dL). Liver Function Tests:  Recent Labs Lab 12/26/16 1555  AST 18  ALT 16*  ALKPHOS 79  BILITOT 0.7  PROT 7.3  ALBUMIN 4.3   No results for input(s): LIPASE, AMYLASE in the last 168  hours. No results for input(s): AMMONIA in the last 168 hours. Coagulation Profile:  Recent Labs Lab 12/26/16 1555  INR 1.05   Cardiac Enzymes:  Recent Labs Lab 12/27/16 0857  TROPONINI <0.03   BNP (last 3 results) No results for input(s): PROBNP in the last 8760 hours. HbA1C: No results for input(s): HGBA1C in the last 72 hours. CBG:  Recent Labs Lab 12/26/16 1636 12/27/16 0637 12/27/16 0919 12/27/16 1336 12/27/16 1757  GLUCAP 173* 122* 169* 229* 305*   Lipid Profile: No results for input(s): CHOL, HDL, LDLCALC, TRIG, CHOLHDL, LDLDIRECT in the last 72 hours. Thyroid Function Tests:  Recent Labs  12/26/16 2204  TSH 5.624*   Anemia Panel: No results for input(s): VITAMINB12, FOLATE, FERRITIN, TIBC, IRON, RETICCTPCT in the last 72 hours. Sepsis Labs:  No results for input(s): PROCALCITON, LATICACIDVEN in the last 168 hours.  No results found for this or any previous visit (from the past 240 hour(s)).       Radiology Studies: Ct Angio Head W Or Wo Contrast  Result Date: 12/26/2016 CLINICAL DATA:  Code stroke, dizziness and unsteady gait EXAM: CT ANGIOGRAPHY HEAD AND NECK TECHNIQUE: Multidetector CT imaging of the head and neck was performed using the standard protocol during bolus administration of intravenous contrast. Multiplanar CT image reconstructions and MIPs were obtained to evaluate the vascular anatomy. Carotid stenosis measurements (when applicable) are obtained utilizing NASCET criteria, using the distal internal carotid diameter as the denominator. CONTRAST:  50 mL Isovue 370 IV COMPARISON:  CT head 12/26/2016 FINDINGS: CTA NECK FINDINGS Aortic arch: Minimal atherosclerotic disease aortic arch. Left vertebral artery origin from the aortic arch. Proximal great vessels patent Right carotid system: Mild atherosclerotic disease at the right carotid bifurcation without significant stenosis. Left carotid system: Normal Vertebral arteries: Both vertebral arteries  patent to the basilar without significant stenosis. Skeleton: Extensive disc and facet degeneration throughout the cervical spine with multilevel spinal and foraminal stenosis. Extensive facet degeneration. Other neck: Negative for mass or adenopathy Upper chest: 8 mm right upper lobe nodule. Remainder of the upper lobes are clear Review of the MIP images confirms the above findings CTA HEAD FINDINGS Anterior circulation: Mild atherosclerotic calcification in the cavernous carotid bilaterally without significant stenosis. Anterior and middle cerebral arteries patent bilaterally without significant stenosis. 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 Mild stenosis distal right vertebral artery due to calcified plaque. PICA patent bilaterally. Basilar patent. Superior cerebellar and posterior cerebral arteries patent bilaterally. Venous sinuses: Patent Anatomic variants: None Delayed phase: Not performed Review of the MIP images confirms the above findings IMPRESSION: No significant carotid or vertebral artery stenosis. No emergent  large vessel occlusion. 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 8 mm right upper lobe nodule. Recommend elective chest CT for further evaluation. These results were called by telephone at the time of interpretation on 12/26/2016 at 4:45 pm to Dr. Jonna Munro , who verbally acknowledged these results. Electronically Signed   By: Marlan Palau M.D.   On: 12/26/2016 16:46   Dg Chest 2 View  Result Date: 12/27/2016 CLINICAL DATA:  Stroke, dizziness, hypertension, diabetes mellitus EXAM: CHEST  2 VIEW COMPARISON:  10/11/2015 FINDINGS: Normal heart size, mediastinal contours, and pulmonary vascularity. Atherosclerotic calcification aorta. Lungs clear. No pleural effusion or pneumothorax. Bones demineralized. 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  IMPRESSION: No acute abnormalities. Aortic atherosclerosis. Electronically Signed   By: Ulyses Southward M.D.   On: 12/27/2016 14:18   Ct Angio Neck W Or Wo Contrast  Result Date: 12/26/2016 CLINICAL DATA:  Code stroke, dizziness and unsteady gait EXAM: CT ANGIOGRAPHY HEAD AND NECK TECHNIQUE: Multidetector CT imaging of the head and neck was performed using the standard protocol during bolus administration of intravenous contrast. Multiplanar CT image reconstructions and MIPs were obtained to evaluate the vascular anatomy. Carotid stenosis measurements (when applicable) are obtained utilizing NASCET criteria, using the distal internal carotid diameter as the denominator. CONTRAST:  50 mL Isovue 370 IV COMPARISON:  CT head 12/26/2016 FINDINGS: CTA NECK FINDINGS Aortic arch: Minimal atherosclerotic disease aortic arch. Left vertebral artery origin from the aortic arch. Proximal great vessels patent Right carotid system: Mild atherosclerotic disease at the right carotid bifurcation without significant stenosis. Left carotid system:  Normal Vertebral arteries: Both vertebral arteries patent to the basilar without significant stenosis. Skeleton: Extensive disc and facet degeneration throughout the cervical spine with multilevel spinal and foraminal stenosis. Extensive facet degeneration. Other neck: Negative for mass or adenopathy Upper chest: 8 mm right upper lobe nodule. Remainder of the upper lobes are clear Review of the MIP images confirms the above findings CTA HEAD FINDINGS Anterior circulation: Mild atherosclerotic calcification in the cavernous carotid bilaterally without significant stenosis. Anterior and middle cerebral arteries patent bilaterally without significant stenosis. 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 Mild stenosis distal right vertebral artery due to calcified plaque. PICA patent bilaterally. Basilar patent.  Superior cerebellar and posterior cerebral arteries patent bilaterally. Venous sinuses: Patent Anatomic variants: None Delayed phase: Not performed Review of the MIP images confirms the above findings IMPRESSION: No significant carotid or vertebral artery stenosis. No emergent  large vessel occlusion. 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 8 mm right upper lobe nodule. Recommend elective chest CT for further evaluation. These results were called by telephone at the time of interpretation on 12/26/2016 at 4:45 pm to Dr. Jonna Munro , who verbally acknowledged these results. Electronically Signed   By: Marlan Palau M.D.   On: 12/26/2016 16:46   Mr Laqueta Jean ZO Contrast  Result Date: 12/26/2016 CLINICAL DATA:  Acute onset vertigo while working in garden. Gait abnormality. History of hypertension, diabetes. EXAM: MRI HEAD WITHOUT AND WITH CONTRAST TECHNIQUE: Multiplanar, multiecho pulse sequences of the brain and surrounding structures were obtained without and with intravenous contrast. CONTRAST:  18mL MULTIHANCE GADOBENATE DIMEGLUMINE 529 MG/ML IV SOLN COMPARISON:  CT HEAD December 26, 2016 at 1618 hours FINDINGS: BRAIN: No reduced diffusion to suggest acute ischemia. No susceptibility artifact to suggest hemorrhage. The ventricles and sulci are normal for patient's age. Curvilinear subcentimeter FLAIR T2 hyperintense signal RIGHT temporal gyrus with minimal T1 shortening, and slight enhancement. Patchy confluent No suspicious parenchymal signal, masses or mass effect. No abnormal extra-axial fluid collections. VASCULAR: Normal major intracranial vascular flow voids present at skull base. SKULL AND UPPER CERVICAL SPINE: No abnormal sellar expansion. No suspicious calvarial bone marrow signal. Craniocervical junction maintained. SINUSES/ORBITS: Trace paranasal sinus mucosal thickening. Mastoid air cells are well aerated. The included ocular globes and orbital contents are  non-suspicious. OTHER: None. IMPRESSION: No acute intracranial process. Subcentimeter RIGHT temporal lobe subacute infarct with laminar necrosis and slight enhancement. As a conservative measure, recommend follow-up MRI of the brain with contrast in 6-8 weeks to verify improvement. Mild to moderate chronic small vessel ischemic disease. Electronically Signed   By: Awilda Metro M.D.   On: 12/26/2016 21:14   Ct Head Code Stroke W/o Cm  Addendum Date: 12/26/2016   ADDENDUM REPORT: 12/26/2016 16:47 ADDENDUM: These results were called by telephone at the time of interpretation on 12/26/2016 at 4:35 pm to Dr. Lavonna Monarch, who verbally acknowledged these results. Electronically Signed   By: Marlan Palau M.D.   On: 12/26/2016 16:47   Result Date: 12/26/2016 CLINICAL DATA:  Code stroke.  Dizziness and unsteady gait EXAM: CT HEAD WITHOUT CONTRAST TECHNIQUE: Contiguous axial images were obtained from the base of the skull through the vertex without intravenous contrast. COMPARISON:  None. FINDINGS: Brain: Mild atrophy. Patchy hypodensity in the cerebral white matter bilaterally with a chronic appearance. No acute cortical infarct. Negative for hemorrhage or mass Vascular: Atherosclerotic calcification. Negative for hyperdense vessel Skull: Negative Sinuses/Orbits: Negative Other: None ASPECTS (  [place] Stroke Program Early CT Score) - Ganglionic level infarction (caudate, lentiform nuclei, internal capsule, insula, M1-M3 cortex): 7 - Supraganglionic infarction (M4-M6 cortex): 3 Total score (0-10 with 10 being normal): 10 IMPRESSION: 1. Negative for acute infarct. 2. ASPECTS is 10 3. Atrophy and mild chronic white matter changes. Electronically Signed: By: Marlan Palau M.D. On: 12/26/2016 16:36        Scheduled Meds: . ALPRAZolam  0.25 mg Oral QHS  . amLODipine  10 mg Oral Daily  . aspirin EC  325 mg Oral Daily  . enoxaparin (LOVENOX) injection  40 mg Subcutaneous Q24H  . insulin aspart  0-15 Units  Subcutaneous TID WC  . LORazepam  0.5 mg Intravenous Once  . multivitamin with minerals  1 tablet Oral Daily  . omega-3 acid ethyl esters  1 g Oral Daily  . pantoprazole  40 mg Oral Daily  . sodium chloride flush  3 mL Intravenous Q12H   Continuous Infusions:   LOS: 0 days    Time spent: 35 minutes    Renly Roots, MD [facility] Pager [phone]  If 7PM-7AM, please contact night-coverage [email or web address] Password [email or web address] 12/27/2016, 6:11 PM

## 2016-12-27 NOTE — Evaluation (Signed)
Occupational Therapy Evaluation Patient Details Name: [name] MRN: [id number] DOB: August 13, 1941 Today's Date: 12/27/2016    History of Present Illness 76 yo male admitted withdizziness with blurred vision. CT(+) L posterior commuicating artery aneurysm, 8 mm R upper lobe nodule PMH: HTN DM back surg, BIL rotator cuff surg    Clinical Impression   PT admitted with dizziness and blurred vision with pending workup . Pt currently with functional limitiations due to the deficits listed below (see OT problem list). PTA was independent with all adls.  Pt will benefit from skilled OT to increase their independence and safety with adls and balance to allow discharge outpatient OT.     Follow Up Recommendations  Outpatient OT    Equipment Recommendations  3 in 1 bedside commode    Recommendations for Other Services       Precautions / Restrictions Precautions Precautions: Fall      Mobility Bed Mobility Overal bed mobility: Needs Assistance Bed Mobility: Supine to Sit     Supine to sit: Mod assist;HOB elevated     General bed mobility comments: pt needed cues to sequence task and to progress to EOB. Pt reports mobility is around normal but he is a little weaker.  Transfers Overall transfer level: Needs assistance   Transfers: Sit to/from Stand Sit to Stand: Mod assist         General transfer comment: pt requires rocking momentum initially to complete task    Balance Overall balance assessment: Needs assistance Sitting-balance support: Bilateral upper extremity supported;Feet supported Sitting balance-Leahy Scale: Fair     Standing balance support: During functional activity;Single extremity supported Standing balance-Leahy Scale: Poor                             ADL either performed or assessed with clinical judgement   ADL Overall ADL's : Needs assistance/impaired Eating/Feeding: Set up;Bed level Eating/Feeding Details (indicate cue type  and reason): family (A) on arrival Grooming: Wash/dry hands;Min guard Grooming Details (indicate cue type and reason): pt static standing and unable to complete dynamic balance without min (A)  Upper Body Bathing: Minimal assistance   Lower Body Bathing: Moderate assistance           Toilet Transfer: Minimal assistance Toilet Transfer Details (indicate cue type and reason): pt requires holding grab bar on the R side during static standing void of urine         Functional mobility during ADLs: Minimal assistance (hand held (A) ) General ADL Comments: Pt requires L eye occluded with patch due to nystagmus to allow for incr gait speed and holding head up to scan environment. Pt with patch off watching the ground and reaching for environmental supports     Vision Baseline Vision/History: Wears glasses Wears Glasses: Reading only Patient Visual Report: Blurring of vision Vision Assessment?: Yes Eye Alignment: Within Functional Limits Ocular Range of Motion: Within Functional Limits Additional Comments: nystagmus of L eye with rotation L     Perception     Praxis      Pertinent Vitals/Pain Pain Assessment: No/denies pain     Hand Dominance Right   Extremity/Trunk Assessment Upper Extremity Assessment Upper Extremity Assessment: Overall WFL for tasks assessed   Lower Extremity Assessment Lower Extremity Assessment: Defer to PT evaluation   Cervical / Trunk Assessment Cervical / Trunk Assessment: Other exceptions (hx back surg)   Communication Communication Communication: No difficulties   Cognition Arousal/Alertness: Awake/alert Behavior  During Therapy: WFL for tasks assessed/performed Overall Cognitive Status: Within Functional Limits for tasks assessed                                     General Comments  eye patch L eye with mobility recommended at this time for balance     Exercises     Shoulder Instructions      Home Living  Family/patient expects to be discharged to:: Private residence Living Arrangements: Spouse/significant other Available Help at Discharge: Family Type of Home: House Home Access: Stairs to enter [employer] of Steps: 2 Entrance Stairs-Rails: Right;Left Home Layout: One level     Bathroom Shower/Tub: Tub/shower unit;Walk-in shower (normally uses the tub and sits in the bottom)   Bathroom Toilet: Standard (walk in shower has handicapped toilet)     Home Equipment: None   Additional Comments: pt normally gardens this time of the year and raises around 600 plants. Pt anxious to get home and start working on getting plants in the ground.       Prior Functioning/Environment Level of Independence: Independent                 OT Problem List: Decreased strength;Decreased activity tolerance;Impaired vision/perception;Impaired balance (sitting and/or standing);Decreased coordination;Decreased cognition;Decreased safety awareness;Decreased knowledge of use of DME or AE;Cardiopulmonary status limiting activity      OT Treatment/Interventions: Self-care/ADL training;Therapeutic exercise;DME and/or AE instruction;Therapeutic activities;Cognitive remediation/compensation;Visual/perceptual remediation/compensation;Patient/family education;Balance training    OT Goals(Current goals can be found in the care plan section) Acute Rehab OT Goals Patient Stated Goal: to get garden planted OT Goal Formulation: With patient/family Time For Goal Achievement: 01/10/17 Potential to Achieve Goals: Good  OT Frequency: Min 2X/week   Barriers to D/C:            Co-evaluation              End of Session Equipment Utilized During Treatment: Gait belt Nurse Communication: Mobility status;Precautions  Activity Tolerance: Patient tolerated treatment well Patient left: in bed;with call bell/phone within reach;with family/visitor present  OT Visit Diagnosis: Unsteadiness on feet  (R26.81)                Time: 3086-5784 OT Time Calculation (min): 26 min Charges:  OT General Charges $OT Visit: 1 Procedure OT Evaluation $OT Eval Moderate Complexity: 1 Procedure G-Codes: OT G-codes **NOT FOR INPATIENT CLASS** Functional Assessment Tool Used: Clinical judgement Functional Limitation: Self care Self Care Current Status (O9629): At least 20 percent but less than 40 percent impaired, limited or restricted Self Care Goal Status (B2841): At least 1 percent but less than 20 percent impaired, limited or restricted    Mateo Flow   OTR/L Pager: [phone] Office: [phone] .   Boone Master B 12/27/2016, 9:51 AM

## 2016-12-28 ENCOUNTER — Inpatient Hospital Stay (HOSPITAL_COMMUNITY): Payer: Medicare Other

## 2016-12-28 DIAGNOSIS — I10 Essential (primary) hypertension: Secondary | ICD-10-CM

## 2016-12-28 DIAGNOSIS — I6789 Other cerebrovascular disease: Secondary | ICD-10-CM

## 2016-12-28 DIAGNOSIS — R42 Dizziness and giddiness: Secondary | ICD-10-CM

## 2016-12-28 LAB — BASIC METABOLIC PANEL
Anion gap: 8 (ref 5–15)
BUN: 13 mg/dL (ref 6–20)
CO2: 29 mmol/L (ref 22–32)
Calcium: 8.7 mg/dL — ABNORMAL LOW (ref 8.9–10.3)
Chloride: 104 mmol/L (ref 101–111)
Creatinine, Ser: 1.19 mg/dL (ref 0.61–1.24)
GFR calc Af Amer: >60 mL/min (ref >60)
GFR calc non Af Amer: 58 mL/min — ABNORMAL LOW (ref >60)
Glucose, Bld: 143 mg/dL — ABNORMAL HIGH (ref 65–99)
Potassium: 4.1 mmol/L (ref 3.5–5.1)
Sodium: 141 mmol/L (ref 135–145)

## 2016-12-28 LAB — GLUCOSE, CAPILLARY
Glucose-Capillary: 151 mg/dL — ABNORMAL HIGH (ref 65–99)
Glucose-Capillary: 189 mg/dL — ABNORMAL HIGH (ref 65–99)

## 2016-12-28 LAB — LIPID PANEL
Cholesterol: 148 mg/dL (ref 0–200)
HDL: 31 mg/dL — ABNORMAL LOW (ref >40)
LDL Cholesterol: 98 mg/dL (ref 0–99)
Total CHOL/HDL Ratio: 4.8 RATIO
Triglycerides: 96 mg/dL (ref <150)
VLDL: 19 mg/dL (ref 0–40)

## 2016-12-28 LAB — ECHOCARDIOGRAM COMPLETE
Height: 73 in
Weight: 3011.2 oz

## 2016-12-28 LAB — HEMOGLOBIN A1C
Hgb A1c MFr Bld: 6.5 % — ABNORMAL HIGH (ref 4.8–5.6)
Mean Plasma Glucose: 140 mg/dL

## 2016-12-28 MED ORDER — MECLIZINE HCL 25 MG PO TABS: 25.0000 mg | ORAL_TABLET | Freq: Three times a day (TID) | ORAL | 0 refills | Status: AC | PRN

## 2016-12-28 NOTE — Progress Notes (Signed)
Occupational Therapy Treatment Patient Details Name: Goran Olden MRN: [id number] DOB: [birth year] Today's Date: 12/28/2016    History of present illness 76 yo male admitted withdizziness with blurred vision. CT(+) L posterior commuicating artery aneurysm, 8 mm R upper lobe nodule PMH: HTN DM back surg, BIL rotator cuff surg    OT comments  Pt with significant improvement today.  He is independent with ADLs, he reports he's moving slightly slower than his normal, but denies dizziness, and demonstrated no LOB.     Follow Up Recommendations  No OT follow up;Supervision - Intermittent    Equipment Recommendations  None recommended by OT    Recommendations for Other Services      Precautions / Restrictions Precautions Precautions: Fall       Mobility Bed Mobility Overal bed mobility: Independent                Transfers Overall transfer level: Independent                    Balance Overall balance assessment: No apparent balance deficits (not formally assessed)             Standing balance comment: Pt able to stoop down to retrieve items from floor x 3 with no LOB, dizziness, or difficulty.                             ADL either performed or assessed with clinical judgement   ADL Overall ADL's : Modified independent                                             Vision   Additional Comments: no nystagmus or dizziness noted today    Perception     Praxis      Cognition Arousal/Alertness: Awake/alert Behavior During Therapy: WFL for tasks assessed/performed Overall Cognitive Status: Within Functional Limits for tasks assessed                                 General Comments: WFL for basic tasks.  He is able to perform serial subtraction by 7's with no difficulties         Exercises     Shoulder Instructions       General Comments discussed gradually increasing activity with pt and family.   They will assist him with taxing activities, such as gardening.       Pertinent Vitals/ Pain       Pain Assessment: No/denies pain  Home Living                                          Prior Functioning/Environment              Frequency  Min 2X/week        Progress Toward Goals  OT Goals(current goals can now be found in the care plan section)        Plan Discharge plan needs to be updated;Equipment recommendations need to be updated    Co-evaluation                 End of Session    OT  Visit Diagnosis: Unsteadiness on feet (R26.81)   Activity Tolerance Patient tolerated treatment well   Patient Left in bed;with call bell/phone within reach   Nurse Communication Mobility status        Time: 9147-8295 OT Time Calculation (min): 22 min  Charges: OT General Charges $OT Visit: 1 Procedure OT Treatments $Therapeutic Activity: 8-22 mins  [employer], OTR/L [phone]    Jeani Hawking M 12/28/2016, 12:07 PM

## 2016-12-28 NOTE — Progress Notes (Signed)
  Echocardiogram 2D Echocardiogram has been performed.  [name] M 12/28/2016, 12:37 PM

## 2016-12-28 NOTE — Discharge Summary (Signed)
Physician Discharge Summary  [name] ZOX:[id number] DOB: [birth year] DOA: 12/26/2016  PCP: Lillia Mountain, MD  Admit date: 12/26/2016 Discharge date: 12/28/2016  Admitted From:home Disposition:home  Recommendations for Outpatient Follow-up:  1. Follow up with PCP in 1-2 weeks 2. Please obtain BMP/CBC in one week 3.  A1c is pending. Follow-up with PCP.   Home Health: PT/OT Equipment/Devices:none Discharge Condition:stable CODE STATUS:full Diet recommendation:carb modified heart healthy diet  Brief/Interim Summary: 76 year old gentleman history of hypertension, type 2 diabetes, GERD presenting to the ED with sudden onset of dizziness/spinning sensation while working in his garden. Patient's symptoms likely BPPV/new onset vertigo.  New onset Vertigo/dizziness: MRI of the brain and so a possible right temporal lobe punctate infarct which has no correlation with patient's symptoms as per nephrologist. It was questionable that this is artifact and neurologist recommended repeat MRI in 6 months. 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 At this time no intervention recommended and advised repeat CT angiogram outpatient by neurologist. Patient's clinical symptoms improved with meclizine. Outpatient PT OT referral. Echocardiogram with normal systolic function with normal wall motion. Neurology thinks that the patient's symptoms are most likely BPPV.   Resumed home medications on discharge. I advised patient to monitor blood sugar level and blood pressure at home. Patient reported his dizziness has improved. He is able to ambulate. He wants to go home today.  Discussed with the patient and patient's family members regarding importance of follow-up with PCP and neurologist regarding following up repeat imaging studies including MRI of the brain and CT angiogram. They verbalized understanding. Patient is clinically stable. Denied headache, dizziness, nausea,  vomiting, chest pressure shortness of breath.  Patient has LDL of 98. Continue aspirin.  Discharge Diagnoses:  Principal Problem:   Vertigo Active Problems:   Benign essential HTN   Controlled type 2 diabetes mellitus without complication, without long-term current use of insulin (HCC)   Dizziness   GERD (gastroesophageal reflux disease)   Unsteady gait   CVA (cerebral vascular accident) [facility])    Discharge Instructions   Discharge Instructions    Ambulatory referral to Neurology    Complete by:  As directed    An appointment is requested in approximately: 6 weeks   Ambulatory referral to Occupational Therapy    Complete by:  As directed    Ambulatory referral to Occupational Therapy    Complete by:  As directed    Ambulatory referral to Physical Therapy    Complete by:  As directed    Ambulatory referral to Physical Therapy    Complete by:  As directed    Call MD for:  difficulty breathing, headache or visual disturbances    Complete by:  As directed    Call MD for:  extreme fatigue    Complete by:  As directed    Call MD for:  hives    Complete by:  As directed    Call MD for:  persistant dizziness or light-headedness    Complete by:  As directed    Call MD for:  persistant nausea and vomiting    Complete by:  As directed    Call MD for:  severe uncontrolled pain    Complete by:  As directed    Call MD for:  temperature >100.4    Complete by:  As directed    Diet - low sodium heart healthy    Complete by:  As directed    Diet Carb Modified    Complete by:  As directed    Discharge instructions    Complete by:  As directed    Please start metformin from 12/29/2016,  Please follow up with neurologist in 3-4 weeks, please discuss with neurologist regarding repeat repeat MRI of head and CT angio of head. You may need to repeat those tests in 6 months. Please discuss with neurologist and PCP.   Increase activity slowly    Complete by:  As directed      Allergies as  of 12/28/2016   No Known Allergies     Medication List    TAKE these medications   acetaminophen 500 MG tablet Commonly known as:  TYLENOL Take 1,000 mg by mouth every 6 (six) hours as needed for headache (pain).   ALEVE 220 MG tablet Generic drug:  naproxen sodium Take 220 mg by mouth 2 (two) times daily as needed (pain).   ALPRAZolam 0.5 MG tablet Commonly known as:  XANAX Take 0.25 mg by mouth at bedtime.   amLODipine 10 MG tablet Commonly known as:  NORVASC Take 10 mg by mouth daily.   aspirin EC 81 MG tablet Take 81 mg by mouth daily.   fish oil-omega-3 fatty acids 1000 MG capsule Take 1 g by mouth daily.   glimepiride 4 MG tablet Commonly known as:  AMARYL Take 4 mg by mouth daily.   LEG CRAMP RELIEF Tabs Take 1 tablet by mouth at bedtime.   losartan-hydrochlorothiazide 50-12.5 MG tablet Commonly known as:  HYZAAR Take 1 tablet by mouth daily.   meclizine 25 MG tablet Commonly known as:  ANTIVERT Take 1 tablet (25 mg total) by mouth 3 (three) times daily as needed for dizziness.   metFORMIN 500 MG tablet Commonly known as:  GLUCOPHAGE Take 500 mg by mouth 2 (two) times daily with a meal.   multivitamin with minerals Tabs tablet Take 1 tablet by mouth daily.   pantoprazole 40 MG tablet Commonly known as:  PROTONIX Take 1 tablet (40 mg total) by mouth daily.   pioglitazone 15 MG tablet Commonly known as:  ACTOS Take 15 mg by mouth daily.            Durable Medical Equipment        Start     Ordered   12/27/16 1214  For home use only DME 3 n 1  Once     12/27/16 1213     Follow-up Information    [facility] Follow [facility].   Specialty:  Rehabilitation Why:  They will contact you for the first visit.  Contact information: [address] [id number] [place] [address] [phone]       Lillia Mountain, MD. Schedule an appointment as soon as possible for a visit in 1  week(s).   Specialty:  Internal Medicine Contact information: [address] [place] [address] [phone]          No Known Allergies  Consultations: Neurologist  Procedures/Studies:  CT angiogram head and neck 12/26/2016  Chest x-ray 12/27/2016  MRI head with and without contrast 12/26/2016  Carotid Dopplers 12/27/2016  Subjective: Patient was seen and examined at bedside. He reported doing well and eager to go home today. Denied dizziness, headache, nausea, vomiting, chest pain or shortness of breath. Family members at bedside.  Discharge Exam: Vitals:   12/28/16 0940 12/28/16 1403  BP: (!) 139/57 (!) 146/62  Pulse: 74 79  Resp: 20 20  Temp: 98.1 F (36.7 C) 98 F (  36.7 C)   Vitals:   12/28/16 0100 12/28/16 0428 12/28/16 0940 12/28/16 1403  BP: (!) 149/57 (!) 161/64 (!) 139/57 (!) 146/62  Pulse: 73 79 74 79  Resp: Temp: 98.1 F (36.7 C) 97.8 F (36.6 C) 98.1 F (36.7 C) 98 F (36.7 C)  TempSrc: Oral Oral Oral Oral  SpO2: 99% 96% 96% 97%  Weight: 85.4 kg (188 lb 3.2 oz)     Height:        General: Pt is alert, awake, not in acute distress Cardiovascular: RRR, S1/S2 +, no rubs, no gallops Respiratory: CTA bilaterally, no wheezing, no rhonchi Abdominal: Soft, NT, ND, bowel sounds + Extremities: no edema, no cyanosis    The results of significant diagnostics from this hospitalization (including imaging, microbiology, ancillary and laboratory) are listed below for reference.     Microbiology: No results found for this or any previous visit (from the past 240 hour(s)).   Labs: BNP (last 3 results) No results for input(s): BNP in the last 8760 hours. Basic Metabolic Panel:  Recent Labs Lab 12/26/16 1555 12/26/16 1612 12/26/16 2203 12/27/16 0544 12/28/16 0414  NA 138 139  --  139 141  K 3.6 3.5  --  3.2* 4.1  CL 99* 98*  --  100* 104  CO2 29  --   --  29 29  GLUCOSE 186* 189*  --  148* 143*  BUN 16 20   --  12 13  CREATININE 1.15 1.10  --  1.08 1.19  CALCIUM 9.7  --   --  9.4 8.7*  MG  --   --  1.7  --   --    Liver Function Tests:  Recent Labs Lab 12/26/16 1555  AST 18  ALT 16*  ALKPHOS 79  BILITOT 0.7  PROT 7.3  ALBUMIN 4.3   No results for input(s): LIPASE, AMYLASE in the last 168 hours. No results for input(s): AMMONIA in the last 168 hours. CBC:  Recent Labs Lab 12/26/16 1555 12/26/16 1612 12/27/16 0544  WBC 8.2  --  9.3  NEUTROABS 5.6  --   --   HGB 14.5 14.6 13.8  HCT 42.2 43.0 40.2  MCV 87.0  --  86.5  PLT 154  --  146*   Cardiac Enzymes:  Recent Labs Lab 12/27/16 0857  TROPONINI <0.03   BNP: Invalid input(s): POCBNP CBG:  Recent Labs Lab 12/27/16 1336 12/27/16 1757 12/27/16 2143 12/28/16 0631 12/28/16 1122  GLUCAP 229* 305* 127* 151* 189*   D-Dimer No results for input(s): DDIMER in the last 72 hours. Hgb A1c  Recent Labs  12/26/16 2203  HGBA1C 6.5*   Lipid Profile  Recent Labs  12/28/16 0414  CHOL 148  HDL 31*  LDLCALC 98  TRIG 96  CHOLHDL 4.8   Thyroid function studies  Recent Labs  12/26/16 2204  TSH 5.624*   Anemia work up No results for input(s): VITAMINB12, FOLATE, FERRITIN, TIBC, IRON, RETICCTPCT in the last 72 hours. Urinalysis    Component Value Date/Time   COLORURINE YELLOW 12/26/2016 1640   APPEARANCEUR CLEAR 12/26/2016 1640   LABSPEC 1.024 12/26/2016 1640   PHURINE 7.0 12/26/2016 1640   GLUCOSEU 50 (A) 12/26/2016 1640   HGBUR NEGATIVE 12/26/2016 1640   BILIRUBINUR NEGATIVE 12/26/2016 1640   KETONESUR NEGATIVE 12/26/2016 1640   PROTEINUR NEGATIVE 12/26/2016 1640   NITRITE NEGATIVE 12/26/2016 1640   LEUKOCYTESUR NEGATIVE 12/26/2016 1640   Sepsis Labs Invalid input(s): PROCALCITONIN,  WBC,  LACTICIDVEN Microbiology No results found for this or any previous visit (from the past 240 hour(s)).   Time coordinating discharge: 32 minutes  SIGNED:   Maxie Barb, MD  [facility] 12/28/2016, 2:33 PM  If 7PM-7AM, please contact night-coverage [email or web address] Password [email or web address]

## 2016-12-28 NOTE — Care Management Note (Signed)
Case Management Note  Patient Details  Name: [name] MRN: [id number] Date of Birth: 08-01-1941  Subjective/Objective:                    Action/Plan: Patient discharging home with self care. CM consulted for outpatient therapy. CM met with the patient and his family and they are interested in [facility]. Orders in EPIC and information on the AVS.  Pt with orders for  3 in 1. Pt states he does not need this equipment.  Family to provide transportation home.   Expected Discharge Date:  12/30/16               Expected Discharge Plan:  Home/Self Care  In-House Referral:     Discharge planning Services  CM Consult  Post Acute Care Choice:    Choice offered to:     DME Arranged:    DME Agency:     HH Arranged:    HH Agency:     Status of Service:  Completed, signed off  If discussed at [employer] of Stay Meetings, dates discussed:    Additional Comments:  Pollie Friar, RN 12/28/2016, 10:35 AM

## 2016-12-29 LAB — HEMOGLOBIN A1C
Hgb A1c MFr Bld: 6.5 % — ABNORMAL HIGH (ref 4.8–5.6)
Mean Plasma Glucose: 140 mg/dL

## 2017-01-24 ENCOUNTER — Ambulatory Visit (INDEPENDENT_AMBULATORY_CARE_PROVIDER_SITE_OTHER): Payer: Medicare Other | Admitting: Neurology

## 2017-01-24 ENCOUNTER — Encounter: Payer: Self-pay | Admitting: Neurology

## 2017-01-24 VITALS — BP 136/71 | HR 74 | Ht 73.0 in | Wt 186.4 lb

## 2017-01-24 DIAGNOSIS — R42 Dizziness and giddiness: Secondary | ICD-10-CM

## 2017-01-24 MED ORDER — CLOPIDOGREL BISULFATE 75 MG PO TABS: 75.0000 mg | ORAL_TABLET | Freq: Every day | ORAL | 11 refills | Status: DC

## 2017-01-24 NOTE — Patient Instructions (Signed)
I had a long discussion with the patient and his wife regarding his transient episode of isolated vertigo beginning of unclear etiology. Possibilities include vertebrobasilar TIA versus vestibular dysfunction. 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 I recommend conservative follow-up for aneurysm for now but repeat CT angiogram in 6-9 months. Repeat MRI scan of the brain with thin cuts through the internal auditory canal prior to next follow-up visit in 3 months. Change aspirin to Plavix for stroke prevention and strict control of hypertension with blood pressure goal below 130/90 and lipids with LDL cholesterol goal below 70 mg percent. He'll return for follow-up in 3 months or call earlier if necessary  Labyrinthitis Labyrinthitis is an infection of the inner ear. Your inner ear is a system of tubes and canals (labyrinth). These are filled with fluid. Nerve cells in your inner ear send signals for hearing and balance to your brain. When tiny germs get inside the tubes and canals, they harm the cells that send messages to the brain. This can cause changes in hearing and balance. Follow these instructions at home:  Take medicines only as told by your doctor.  If you were prescribed an antibiotic medicine, finish all of it even if you start to feel better.  Rest as much as possible.  Avoid loud noises and bright lights.  Do not make sudden movements until any dizziness goes away.  Do not drive until your doctor says that you can.  Drink enough fluid to keep your pee (urine) clear or pale yellow.  Work with a physical therapist if you still feel dizzy after several weeks. A therapist can teach you exercises to help you deal with your dizziness.  Keep all follow-up visits as told by your doctor. This is important. Contact a doctor if:  Your symptoms do  not get better with medicines.  You do not get better after two weeks.  You have a fever. Get help right away if:  You are very dizzy.  You keep throwing up (vomiting) or keep feeling sick to your stomach (nauseous).  Your hearing gets a lot worse very quickly. This information is not intended to replace advice given to you by your health care provider. Make sure you discuss any questions you have with your health care provider. Document Released: 09/05/2005 Document Revised: 02/11/2016 Document Reviewed: 06/17/2014 Elsevier Interactive Patient Education  2017 [employer]

## 2017-01-24 NOTE — Progress Notes (Signed)
[facility] [address] [place] [address] [phone]       OFFICE CONSULT NOTE  Gabriel. [name] Date of Birth:  [birth year] Medical Record Number:  [id number]   Referring MD:  Ronalee BeltsBhandari  Reason for Referral:  Vertigo  HPI: [name] is a [age] year pleasant caucasian male seen for consultation today. He is accompanied by his wife. History is obtained from the patient, wife and review of electronic medical records. I have personally reviewed imaging films. He presented with sudden onset of vertigo while he was working out in the yard on 12/27/16. He felt all of a sudden objects started moving counterclockwise towards the left. When he started to walk he was leaning to the left but was able to hold on and walking make it to a chair and sat down. This vertigo persisted despite any specific head movements even with eyes closed. He denied any nausea, vertigo. He was evaluated in the emergency room by: neurohospitalist Dr Lavonna Monarchakakni.And thought to have benign paroxysmal positional vertigo. Dix-Hallpike maneuver demonstrated rotatory nystagmus to the right greater than left. Patient's symptoms lasted an hour and half and resolved. Patient had no prior history of tinnitus, vertigo or inner ear problems. He states his symptoms completely resolved and have not recurred since then. He had MRI scan of the brain which showed no definite acute infarct or vascular lesion. A small subcentimeter lesion was noted in the right mid temporal cortex showing faint enhancement and some gliosis and was felt to be of indeterminate etiology possibly a subacute infarct. 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 Patient has no known prior history of inner ear problems, tinnitus, hearing loss, ear infections or vertigo. He has no family history of aneurysms and does not smoke and  states blood pressure control has not been a problem.  ROS:   14 system review of systems is positive for  vertigo and dizziness only and all other systems negative  PMH:  Past Medical History:  Diagnosis Date  . Diabetes mellitus without complication (HCC)   . GERD (gastroesophageal reflux disease) 12/26/2016  . Hypertension     Social History:  Social History   Social History  . Marital status: Married    Spouse name: N/A  . Number of children: N/A  . Years of education: N/A   Occupational History  . Not on file.   Social History Main Topics  . Smoking status: Never Smoker  . Smokeless tobacco: Never Used  . Alcohol use No  . Drug use: No  . Sexual activity: Not on file   Other Topics Concern  . Not on file   Social History Narrative  . No narrative on file    Medications:   Current Outpatient Prescriptions on File Prior to Visit  Medication Sig Dispense Refill  . acetaminophen (TYLENOL) 500 MG tablet Take 1,000 mg by mouth every 6 (six) hours as needed for headache (pain).    Marland Kitchen. ALPRAZolam (XANAX) 0.5 MG tablet Take 0.25 mg by mouth at bedtime.    Marland Kitchen. amLODipine (NORVASC) 10 MG tablet Take 10 mg by mouth daily.     . fish oil-omega-3 fatty acids 1000 MG capsule Take 1 g by mouth daily.    Marland Kitchen. glimepiride (AMARYL) 4 MG tablet Take 4 mg by mouth daily.    . Homeopathic Products (LEG CRAMP RELIEF) TABS Take 1  tablet by mouth at bedtime.    Marland Kitchen losartan-hydrochlorothiazide (HYZAAR) 50-12.5 MG tablet Take 1 tablet by mouth daily.    . meclizine (ANTIVERT) 25 MG tablet Take 1 tablet (25 mg total) by mouth 3 (three) times daily as needed for dizziness. 30 tablet 0  . metFORMIN (GLUCOPHAGE) 500 MG tablet Take 500 mg by mouth 2 (two) times daily with a meal.    . Multiple Vitamin (MULTIVITAMIN WITH MINERALS) TABS Take 1 tablet by mouth daily.    . pioglitazone (ACTOS) 15 MG tablet Take 15 mg by mouth daily.     No current facility-administered medications on file prior to  visit.     Allergies:  No Known Allergies  Physical Exam General: Pleasant elderly Caucasian male, seated, in no evident distress Head: head normocephalic and atraumatic.   Neck: supple with no carotid or supraclavicular bruits Cardiovascular: regular rate and rhythm, no murmurs Musculoskeletal: no deformity Skin:  no rash/petichiae Vascular:  Normal pulses all extremities  Neurologic Exam Mental Status: Awake and fully alert. Oriented to place and time. Recent and remote memory intact. Attention span, concentration and fund of knowledge appropriate. Mood and affect appropriate.  Cranial Nerves: Fundoscopic exam reveals sharp disc margins. Pupils equal, briskly reactive to light. Extraocular movements full without nystagmus. Visual fields full to confrontation. Hearing intact. Facial sensation intact. Face, tongue, palate moves normally and symmetrically.  Motor: Normal bulk and tone. Normal strength in all tested extremity muscles. Sensory.: intact to touch , pinprick , position and vibratory sensation.  Coordination: Rapid alternating movements normal in all extremities. Finger-to-nose and heel-to-shin performed accurately bilaterally. Fukuda stepping test is negative. Head shaking is negative. Hallpike maneuver not performed. Gait and Station: Arises from chair without difficulty. Stance is normal. Gait demonstrates normal stride length and balance . Able to heel, toe and tandem walk with mild difficulty.  Reflexes: 1+ and symmetric. Toes downgoing.   NIHSS  0 Modified Rankin  0   ASSESSMENT: 78 year Caucasian male with sudden onset of transient isolated vertigo without accompanying signs of unclear etiology. Possibilities include s labyrinthine dysfunction. Given negative neurovascular workup though absence of worsening of his symptoms related to position change is unusual. 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    PLAN: I had a long discussion with the patient and his wife regarding his transient episode of isolated vertigo beginning of unclear etiology. Possibilities include vertebrobasilar TIA versus vestibular dysfunction. 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 I recommend conservative follow-up for aneurysm for now but repeat CT angiogram in 6-9 months. Repeat MRI scan of the brain with thin cuts through the internal auditory canal prior to next follow-up visit in 3 months. Change aspirin to Plavix for stroke prevention and strict control of hypertension with blood pressure goal below 130/90 and lipids with LDL cholesterol goal below 70 mg percent. He'll return for follow-up in 3 months or call earlier if necessary Delia Heady, MD  [facility] [address] [place], [place] [address]  Phone [phone] Fax [phone] Note: This document was prepared with digital dictation and possible smart phrase technology. Any transcriptional errors that result from this process are unintentional.

## 2017-02-14 ENCOUNTER — Telehealth: Payer: Self-pay | Admitting: Neurology

## 2017-02-14 NOTE — Telephone Encounter (Signed)
Report put on Dr. Pearlean BrownieSethi desk to review.

## 2017-02-14 NOTE — Telephone Encounter (Signed)
Pt's wife request MRI results °

## 2017-02-20 NOTE — Telephone Encounter (Signed)
Patient's wife calling again for MRI results.

## 2017-02-20 NOTE — Telephone Encounter (Signed)
I called the patient at listed home number but unable to get him. I left message on answering machine about MRI results showing no worrisome finding. Patient was advised to call me if he needed to discuss results further

## 2017-02-24 NOTE — Telephone Encounter (Signed)
Dr.Sethi can you call wife again at [phone] or home [phone] to discuss MR brain with without contrast. Thanks

## 2017-02-27 NOTE — Telephone Encounter (Signed)
I called both home and mobile numbers. Home number came busy and left message on wife's mobile number to call me back to discuss MRI results

## 2017-03-17 ENCOUNTER — Telehealth: Payer: Self-pay | Admitting: Neurology

## 2017-03-17 NOTE — Telephone Encounter (Signed)
Pt wife calling re: concerns of an aneurism they were told about in April, were told it is very small and shouldn't bother him but wife states each time pt sits in his recliner he is unable to lay his head back without pain in the back of his head.  Pt wife would like to discuss with concern

## 2017-03-20 NOTE — Telephone Encounter (Signed)
I spoke to the patient's wife and showed her that patient's neck pain seems more mechanical and musculoskeletal in nature and unrelated to the aneurysm. He was advised to keep his scheduled appointment with me next month to discuss aneurysm treatment further at that visit

## 2017-05-01 ENCOUNTER — Ambulatory Visit: Payer: Medicare Other | Admitting: Neurology

## 2017-05-16 ENCOUNTER — Ambulatory Visit (INDEPENDENT_AMBULATORY_CARE_PROVIDER_SITE_OTHER): Payer: Medicare Other | Admitting: Neurology

## 2017-05-16 ENCOUNTER — Encounter: Payer: Self-pay | Admitting: Neurology

## 2017-05-16 ENCOUNTER — Encounter (INDEPENDENT_AMBULATORY_CARE_PROVIDER_SITE_OTHER): Payer: Self-pay

## 2017-05-16 VITALS — BP 138/71 | HR 79 | Wt 184.6 lb

## 2017-05-16 DIAGNOSIS — M542 Cervicalgia: Secondary | ICD-10-CM

## 2017-05-16 NOTE — Patient Instructions (Signed)
I had a long discussion the patient and his wife regarding silent strokes, small P-comm aneurysm and no new complaints of posterior neck pain and headache likely musculoskeletal in nature and answered questions. I recommend he continue Plavix for stroke prevention and maintenance control of hypertension with blood pressure goal below 130/90, lipids with LDL Goal below 70 Mg Percent and Diabetes with Hemoglobin A1c Goal below 6.5%. I Recommend He Do Regular Neck Stretching Exercises and Local Heat for His Posterior Neck Pain. 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 He'll Return for Follow-Up in 6 Months or Call Earlier If Necessary.  Neck Exercises Neck exercises can be important for many reasons:  They can help you to improve and maintain flexibility in your neck. This can be especially important as you age.  They can help to make your neck stronger. This can make movement easier.  They can reduce or prevent neck pain.  They may help your upper back.  Ask your health care provider which neck exercises would be best for you. Exercises Neck Press Repeat this exercise 10 times. Do it first thing in the morning and right before bed or as told by your health care provider. 1. Lie on your back on a firm bed or on the floor with a pillow under your head. 2. Use your neck muscles to push your head down on the pillow and straighten your spine. 3. Hold the position as well as you can. Keep your head facing up and your chin tucked. 4. Slowly count to 5 while holding this position. 5. Relax for a few seconds. Then repeat.  Isometric Strengthening Do a full set of these exercises 2 times a day or as told by your health care provider. 1. Sit in a supportive chair and place your hand on your forehead. 2. Push forward with your head and neck while pushing back with your hand. Hold for 10 seconds. 3. Relax. Then  repeat the exercise 3 times. 4. Next, do thesequence again, this time putting your hand against the back of your head. Use your head and neck to push backward against the hand pressure. 5. Finally, do the same exercise on either side of your head, pushing sideways against the pressure of your hand.  Prone Head Lifts Repeat this exercise 5 times. Do this 2 times a day or as told by your health care provider. 1. Lie face-down, resting on your elbows so that your chest and upper back are raised. 2. Start with your head facing downward, near your chest. Position your chin either on or near your chest. 3. Slowly lift your head upward. Lift until you are looking straight ahead. Then continue lifting your head as far back as you can stretch. 4. Hold your head up for 5 seconds. Then slowly lower it to your starting position.  Supine Head Lifts Repeat this exercise 8-10 times. Do this 2 times a day or as told by your health care provider. 1. Lie on your back, bending your knees to point to the ceiling and keeping your feet flat on the floor. 2. Lift your head slowly off the floor, raising your chin toward your chest. 3. Hold for 5 seconds. 4. Relax and repeat.  Scapular Retraction Repeat this exercise 5 times. Do this 2 times a day or as told by your health care provider. 1. Stand with your arms at your sides. Look straight ahead. 2. Slowly pull  both shoulders backward and downward until you feel a stretch between your shoulder blades in your upper back. 3. Hold for 10-30 seconds. 4. Relax and repeat.  Contact a health care provider if:  Your neck pain or discomfort gets much worse when you do an exercise.  Your neck pain or discomfort does not improve within 2 hours after you exercise. If you have any of these problems, stop exercising right away. Do not do the exercises again unless your health care provider says that you can. Get help right away if:  You develop sudden, severe neck pain.  If this happens, stop exercising right away. Do not do the exercises again unless your health care provider says that you can. Exercises Neck Stretch  Repeat this exercise 3-5 times. 1. Do this exercise while standing or while sitting in a chair. 2. Place your feet flat on the floor, shoulder-width apart. 3. Slowly turn your head to the right. Turn it all the way to the right so you can look over your right shoulder. Do not tilt or tip your head. 4. Hold this position for 10-30 seconds. 5. Slowly turn your head to the left, to look over your left shoulder. 6. Hold this position for 10-30 seconds.  Neck Retraction Repeat this exercise 8-10 times. Do this 3-4 times a day or as told by your health care provider. 1. Do this exercise while standing or while sitting in a sturdy chair. 2. Look straight ahead. Do not bend your neck. 3. Use your fingers to push your chin backward. Do not bend your neck for this movement. Continue to face straight ahead. If you are doing the exercise properly, you will feel a slight sensation in your throat and a stretch at the back of your neck. 4. Hold the stretch for 1-2 seconds. Relax and repeat.  This information is not intended to replace advice given to you by your health care provider. Make sure you discuss any questions you have with your health care provider. Document Released: 08/17/2015 Document Revised: 02/11/2016 Document Reviewed: 03/16/2015 Elsevier Interactive Patient Education  2017 [employer].

## 2017-05-17 NOTE — Progress Notes (Signed)
[facility] [address] [place]. [place] [address] [phone]       OFFICE FOLLOW UP VISIT NOTE  Gabriel. [name] Date of Birth:  Feb 10, 1941 Medical Record Number:  [id number]   Referring MD:  Ronalee Belts  Reason for Referral:  Vertigo  [id number] Consult 01/24/17 ;  [name] is a 85 year pleasant caucasian male seen for consultation today. He is accompanied by his wife. History is obtained from the patient, wife and review of electronic medical records. I have personally reviewed imaging films. He presented with sudden onset of vertigo while he was working out in the yard on 12/27/16. He felt all of a sudden objects started moving counterclockwise towards the left. When he started to walk he was leaning to the left but was able to hold on and walking make it to a chair and sat down. This vertigo persisted despite any specific head movements even with eyes closed. He denied any nausea, vertigo. He was evaluated in the emergency room by: neurohospitalist Dr Lavonna Monarch.And thought to have benign paroxysmal positional vertigo. Dix-Hallpike maneuver demonstrated rotatory nystagmus to the right greater than left. Patient's symptoms lasted an hour and half and resolved. Patient had no prior history of tinnitus, vertigo or inner ear problems. He states his symptoms completely resolved and have not recurred since then. He had MRI scan of the brain which showed no definite acute infarct or vascular lesion. A small subcentimeter lesion was noted in the right mid temporal cortex showing faint enhancement and some gliosis and was felt to be of indeterminate etiology possibly a subacute infarct. 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 Patient has no known prior history of inner ear problems, tinnitus, hearing loss, ear infections or vertigo. He has no family history of  aneurysms and does not smoke and states blood pressure control has not been a problem. Update 05/16/2017 : He returns for follow-up after last visit 3-1/2 months ago. He states is doing a lot better and is no longer feeling dizzy. He does complain of mild posterior headaches for the last 2 months. He feels that is hurting mostly towards the back when his mind on a recliner. He describes a sense of heaviness and tightness and posterior neck and back of his head. He rarely needs to take medications as this is not disabling. He denies any nausea light or sound sensitivity. He remains on Plavix which is tolerating well without bleeding or bruising. His blood pressure is well controlled and today it is 138/71. Fasting malignancy was 6.8. Patient had MRI scan of the brain done with and without contrast with special attention to internal auditory canals on 02/07/17 at [facility] which are personally reviewed shows no structural lesion or tumor. There are mild changes of chronic microvascular ischemia but no acute abnormality. ROS:   14 system review of systems is positive for  light sensitivity, headache dizziness only and all other systems negative  PMH:  Past Medical History:  Diagnosis Date  . Diabetes mellitus without complication (HCC)   . GERD (gastroesophageal reflux disease) 12/26/2016  . Hypertension     Social History:  Social History   Social History  . Marital status: Married    Spouse name: N/A  . Number of children: N/A  . Years of education: N/A   Occupational History  . Not on file.  Social History Main Topics  . Smoking status: Never Smoker  . Smokeless tobacco: Never Used  . Alcohol use No  . Drug use: No  . Sexual activity: Not on file   Other Topics Concern  . Not on file   Social History Narrative  . No narrative on file    Medications:   Current Outpatient Prescriptions on File Prior to Visit  Medication Sig Dispense Refill  . acetaminophen  (TYLENOL) 500 MG tablet Take 1,000 mg by mouth every 6 (six) hours as needed for headache (pain).    Marland Kitchen ALPRAZolam (XANAX) 0.5 MG tablet Take 0.25 mg by mouth at bedtime.    Marland Kitchen amLODipine (NORVASC) 10 MG tablet Take 10 mg by mouth daily.     . clopidogrel (PLAVIX) 75 MG tablet Take 1 tablet (75 mg total) by mouth daily. 30 tablet 11  . fish oil-omega-3 fatty acids 1000 MG capsule Take 1 g by mouth daily.    Marland Kitchen glimepiride (AMARYL) 4 MG tablet Take 4 mg by mouth daily.    . Homeopathic Products (LEG CRAMP RELIEF) TABS Take 1 tablet by mouth at bedtime.    Marland Kitchen losartan-hydrochlorothiazide (HYZAAR) 50-12.5 MG tablet Take 1 tablet by mouth daily.    . meclizine (ANTIVERT) 25 MG tablet Take 1 tablet (25 mg total) by mouth 3 (three) times daily as needed for dizziness. 30 tablet 0  . metFORMIN (GLUCOPHAGE) 500 MG tablet Take 500 mg by mouth 2 (two) times daily with a meal.    . Multiple Vitamin (MULTIVITAMIN WITH MINERALS) TABS Take 1 tablet by mouth daily.    . pioglitazone (ACTOS) 15 MG tablet Take 15 mg by mouth daily.     No current facility-administered medications on file prior to visit.     Allergies:  No Known Allergies  Physical Exam General: Pleasant elderly Caucasian male, seated, in no evident distress Head: head normocephalic and atraumatic.   Neck: supple with no carotid or supraclavicular bruits Cardiovascular: regular rate and rhythm, no murmurs Musculoskeletal: no deformity Skin:  no rash/petichiae Vascular:  Normal pulses all extremities  Neurologic Exam Mental Status: Awake and fully alert. Oriented to place and time. Recent and remote memory intact. Attention span, concentration and fund of knowledge appropriate. Mood and affect appropriate.  Cranial Nerves: Fundoscopic exam reveals sharp disc margins. Pupils equal, briskly reactive to light. Extraocular movements full without nystagmus. Visual fields full to confrontation. Hearing intact. Facial sensation intact. Face, tongue,  palate moves normally and symmetrically.  Motor: Normal bulk and tone. Normal strength in all tested extremity muscles. Sensory.: intact to touch , pinprick , position and vibratory sensation.  Coordination: Rapid alternating movements normal in all extremities. Finger-to-nose and heel-to-shin performed accurately bilaterally. Fukuda stepping test is negative. Head shaking is negative. Hallpike maneuver not performed. Gait and Station: Arises from chair without difficulty. Stance is normal. Gait demonstrates normal stride length and balance . Able to heel, toe and tandem walk with mild difficulty.  Reflexes: 1+ and symmetric. Toes downgoing.       ASSESSMENT: 43 year Caucasian male with sudden onset of transient isolated vertigo without accompanying signs of unclear etiology. 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    PLAN: I had a long discussion the patient and his wife regarding silent strokes, small P-comm aneurysm and no new complaints of posterior neck pain and headache likely musculoskeletal in nature and answered questions. I recommend he continue Plavix for stroke prevention and maintenance  control of hypertension with blood pressure goal below 130/90, lipids with LDL Goal below 70 Mg Percent and Diabetes with Hemoglobin A1c Goal below 6.5%. I Recommend He Do Regular Neck Stretching Exercises and Local Heat for His Posterior Neck Pain. 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 He'll Return for Follow-Up in 6 Months or Call Earlier If Necessary. Greater than 50% time during this 25 minute visit was spent on counseling and coordination of care about his dizziness and asymptomatic cerebral aneurysm and answering questions Delia Heady, MD  [facility] [address] [place], [place] [address]  Phone  [phone] Fax [phone] Note: This document was prepared with digital dictation and possible smart phrase technology. Any transcriptional errors that result from this process are unintentional.

## 2017-11-27 ENCOUNTER — Encounter (INDEPENDENT_AMBULATORY_CARE_PROVIDER_SITE_OTHER): Payer: Self-pay

## 2017-11-27 ENCOUNTER — Ambulatory Visit: Payer: Medicare Other | Admitting: Neurology

## 2017-11-27 ENCOUNTER — Encounter: Payer: Self-pay | Admitting: Neurology

## 2017-11-27 ENCOUNTER — Telehealth: Payer: Self-pay | Admitting: Neurology

## 2017-11-27 VITALS — BP 138/64 | HR 75 | Wt 187.6 lb

## 2017-11-27 DIAGNOSIS — G44209 Tension-type headache, unspecified, not intractable: Secondary | ICD-10-CM | POA: Diagnosis not present

## 2017-11-27 DIAGNOSIS — I671 Cerebral aneurysm, nonruptured: Secondary | ICD-10-CM | POA: Diagnosis not present

## 2017-11-27 NOTE — Patient Instructions (Signed)
I had a long discussion the patient and his wife regarding silent strokes, small P-comm aneurysm and no new complaints of posterior neck pain and headache likely musculoskeletal in nature and answered questions. I recommend he continue Plavix for stroke prevention and maintenance control of hypertension with blood pressure goal below 130/90, lipids with LDL goal below 70 mg percent and diabetes with hemoglobin A1c goal below 6.5%. I  recommend he do regular neck stretching exercises for his musculoskeletal neck pain and increase participation in activities for stress laxation. Also consider application of local heat for relief. Check follow-up CT angiogram of the brain to follow his posterior communicating artery aneurysm. Return for follow-up in a year or call earlier if necessary  Neck Exercises Neck exercises can be important for many reasons:  They can help you to improve and maintain flexibility in your neck. This can be especially important as you age.  They can help to make your neck stronger. This can make movement easier.  They can reduce or prevent neck pain.  They may help your upper back.  Ask your health care provider which neck exercises would be best for you. Exercises Neck Press Repeat this exercise 10 times. Do it first thing in the morning and right before bed or as told by your health care provider. 1. Lie on your back on a firm bed or on the floor with a pillow under your head. 2. Use your neck muscles to push your head down on the pillow and straighten your spine. 3. Hold the position as well as you can. Keep your head facing up and your chin tucked. 4. Slowly count to 5 while holding this position. 5. Relax for a few seconds. Then repeat.  Isometric Strengthening Do a full set of these exercises 2 times a day or as told by your health care provider. 1. Sit in a supportive chair and place your hand on your forehead. 2. Push forward with your head and neck while pushing  back with your hand. Hold for 10 seconds. 3. Relax. Then repeat the exercise 3 times. 4. Next, do thesequence again, this time putting your hand against the back of your head. Use your head and neck to push backward against the hand pressure. 5. Finally, do the same exercise on either side of your head, pushing sideways against the pressure of your hand.  Prone Head Lifts Repeat this exercise 5 times. Do this 2 times a day or as told by your health care provider. 1. Lie face-down, resting on your elbows so that your chest and upper back are raised. 2. Start with your head facing downward, near your chest. Position your chin either on or near your chest. 3. Slowly lift your head upward. Lift until you are looking straight ahead. Then continue lifting your head as far back as you can stretch. 4. Hold your head up for 5 seconds. Then slowly lower it to your starting position.  Supine Head Lifts Repeat this exercise 8-10 times. Do this 2 times a day or as told by your health care provider. 1. Lie on your back, bending your knees to point to the ceiling and keeping your feet flat on the floor. 2. Lift your head slowly off the floor, raising your chin toward your chest. 3. Hold for 5 seconds. 4. Relax and repeat.  Scapular Retraction Repeat this exercise 5 times. Do this 2 times a day or as told by your health care provider. 1. Stand with your arms at  your sides. Look straight ahead. 2. Slowly pull both shoulders backward and downward until you feel a stretch between your shoulder blades in your upper back. 3. Hold for 10-30 seconds. 4. Relax and repeat.  Contact a health care provider if:  Your neck pain or discomfort gets much worse when you do an exercise.  Your neck pain or discomfort does not improve within 2 hours after you exercise. If you have any of these problems, stop exercising right away. Do not do the exercises again unless your health care provider says that you can. Get  help right away if:  You develop sudden, severe neck pain. If this happens, stop exercising right away. Do not do the exercises again unless your health care provider says that you can. Exercises Neck Stretch  Repeat this exercise 3-5 times. 1. Do this exercise while standing or while sitting in a chair. 2. Place your feet flat on the floor, shoulder-width apart. 3. Slowly turn your head to the right. Turn it all the way to the right so you can look over your right shoulder. Do not tilt or tip your head. 4. Hold this position for 10-30 seconds. 5. Slowly turn your head to the left, to look over your left shoulder. 6. Hold this position for 10-30 seconds.  Neck Retraction Repeat this exercise 8-10 times. Do this 3-4 times a day or as told by your health care provider. 1. Do this exercise while standing or while sitting in a sturdy chair. 2. Look straight ahead. Do not bend your neck. 3. Use your fingers to push your chin backward. Do not bend your neck for this movement. Continue to face straight ahead. If you are doing the exercise properly, you will feel a slight sensation in your throat and a stretch at the back of your neck. 4. Hold the stretch for 1-2 seconds. Relax and repeat.  This information is not intended to replace advice given to you by your health care provider. Make sure you discuss any questions you have with your health care provider. Document Released: 08/17/2015 Document Revised: 02/11/2016 Document Reviewed: 03/16/2015 Elsevier Interactive Patient Education  [employer].

## 2017-11-27 NOTE — Telephone Encounter (Signed)
11/27/17 [employer] order sent to [facility]

## 2017-11-27 NOTE — Progress Notes (Signed)
[facility] [address] [place]. [place] [address] [phone]       OFFICE FOLLOW UP VISIT NOTE  Gabriel. [name] Date of Birth:  1940-10-18 Medical Record Number:  [id number]   Referring MD:  Carolin Sicks  Reason for Referral:  Vertigo  QVZ:[id number] Consult 01/24/17 ;  [name] is a 59 year pleasant caucasian male seen for consultation today. He is accompanied by his wife. History is obtained from the patient, wife and review of electronic medical records. I have personally reviewed imaging films. He presented with sudden onset of vertigo while he was working out in the yard on 12/27/16. He felt all of a sudden objects started moving counterclockwise towards the left. When he started to walk he was leaning to the left but was able to hold on and walking make it to a chair and sat down. This vertigo persisted despite any specific head movements even with eyes closed. He denied any nausea, vertigo. He was evaluated in the emergency room by: neurohospitalist Dr Paschal Dopp.And thought to have benign paroxysmal positional vertigo. Dix-Hallpike maneuver demonstrated rotatory nystagmus to the right greater than left. Patient's symptoms lasted an hour and half and resolved. Patient had no prior history of tinnitus, vertigo or inner ear problems. He states his symptoms completely resolved and have not recurred since then. He had MRI scan of the brain which showed no definite acute infarct or vascular lesion. A small subcentimeter lesion was noted in the right mid temporal cortex showing faint enhancement and some gliosis and was felt to be of indeterminate etiology possibly a subacute infarct. 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 Patient has no known prior history of inner ear problems, tinnitus, hearing loss, ear infections or vertigo. He has no family history of  aneurysms and does not smoke and states blood pressure control has not been a problem. Update 05/16/2017 : He returns for follow-up after last visit 3-1/2 months ago. He states is doing a lot better and is no longer feeling dizzy. He does complain of mild posterior headaches for the last 2 months. He feels that is hurting mostly towards the back when his mind on a recliner. He describes a sense of heaviness and tightness and posterior neck and back of his head. He rarely needs to take medications as this is not disabling. He denies any nausea light or sound sensitivity. He remains on Plavix which is tolerating well without bleeding or bruising. His blood pressure is well controlled and today it is 138/71. Fasting malignancy was 6.8. Patient had MRI scan of the brain done with and without contrast with special attention to internal auditory canals on 02/07/17 at [facility] which are personally reviewed shows no structural lesion or tumor. There are mild changes of chronic microvascular ischemia but no acute abnormality. Update 11/27/2017 : He returns for follow-up after last visit 6 months ago. He is accompanied by his wife. He states is doing well and has not had any dizzy spells. Continues to have mild posterior headaches and neck pain particularly when his bleeding is that back in a certain position on the recliner. He feels that below it feels better. He denies any right leg pain. Is not had any recurrent vertigo episode either. He has been doing regular neck stretching exercises. Is tolerating Plavix well without bleeding or bruising. He states his blood pressure  is under good control and today it is 138/84. His fasting sugars have also been quite good. He is concerned about his brain aneurysm and wants follow-up CT angiogram to be done. ROS:   14 system review of systems is positive for  posterior neck pain headache dizziness only and all other systems negative  PMH:  Past Medical  History:  Diagnosis Date  . Diabetes mellitus without complication (HCC)   . GERD (gastroesophageal reflux disease) 12/26/2016  . Hypertension     Social History:  Social History   Socioeconomic History  . Marital status: Married    Spouse name: Not on file  . Number of children: Not on file  . Years of education: Not on file  . Highest education level: Not on file  Social Needs  . Financial resource strain: Not on file  . Food insecurity - worry: Not on file  . Food insecurity - inability: Not on file  . Transportation needs - medical: Not on file  . Transportation needs - non-medical: Not on file  Occupational History  . Not on file  Tobacco Use  . Smoking status: Never Smoker  . Smokeless tobacco: Never Used  Substance and Sexual Activity  . Alcohol use: No  . Drug use: No  . Sexual activity: Not on file  Other Topics Concern  . Not on file  Social History Narrative  . Not on file    Medications:   Current Outpatient Medications on File Prior to Visit  Medication Sig Dispense Refill  . acetaminophen (TYLENOL) 500 MG tablet Take 1,000 mg by mouth every 6 (six) hours as needed for headache (pain).    Marland Kitchen ALPRAZolam (XANAX) 0.5 MG tablet Take 0.25 mg by mouth at bedtime.    Marland Kitchen amLODipine (NORVASC) 10 MG tablet Take 10 mg by mouth daily.     . clopidogrel (PLAVIX) 75 MG tablet Take 1 tablet (75 mg total) by mouth daily. 30 tablet 11  . glimepiride (AMARYL) 4 MG tablet Take 4 mg by mouth daily.    Marland Kitchen losartan-hydrochlorothiazide (HYZAAR) 50-12.5 MG tablet Take 1 tablet by mouth daily.    . meclizine (ANTIVERT) 25 MG tablet Take 1 tablet (25 mg total) by mouth 3 (three) times daily as needed for dizziness. 30 tablet 0  . metFORMIN (GLUCOPHAGE) 500 MG tablet Take 500 mg by mouth 2 (two) times daily with a meal.    . Multiple Vitamin (MULTIVITAMIN WITH MINERALS) TABS Take 1 tablet by mouth daily.    . pioglitazone (ACTOS) 15 MG tablet Take 15 mg by mouth daily.     No current  facility-administered medications on file prior to visit.     Allergies:  No Known Allergies  Physical Exam General: Pleasant elderly Caucasian male, seated, in no evident distress Head: head normocephalic and atraumatic.   Neck: supple with no carotid or supraclavicular bruits Cardiovascular: regular rate and rhythm, no murmurs Musculoskeletal: no deformity. Mild spasm of posterior neck muscles with restriction of movements in all directions. Skin:  no rash/petichiae Vascular:  Normal pulses all extremities  Neurologic Exam Mental Status: Awake and fully alert. Oriented to place and time. Recent and remote memory intact. Attention span, concentration and fund of knowledge appropriate. Mood and affect appropriate.  Cranial Nerves: Fundoscopic exam  Not done Pupils equal, briskly reactive to light. Extraocular movements full without nystagmus. Visual fields full to confrontation. Hearing intact. Facial sensation intact. Face, tongue, palate moves normally and symmetrically.  Motor: Normal bulk and tone. Normal  strength in all tested extremity muscles. Sensory.: intact to touch , pinprick , position and vibratory sensation.  Coordination: Rapid alternating movements normal in all extremities. Finger-to-nose and heel-to-shin performed accurately bilaterally. Fukuda stepping test is negative. Head shaking is negative. Hallpike maneuver not performed. Gait and Station: Arises from chair without difficulty. Stance is normal. Gait demonstrates normal stride length and balance . Able to heel, toe and tandem walk with mild difficulty.  Reflexes: 1+ and symmetric. Toes downgoing.       ASSESSMENT: [age] year Caucasian male with sudden onset of transient isolated vertigo without accompanying signs of unclear etiology. 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 Persistent posterior head and neck pain likely tension headaches    PLAN: I  had a long discussion the patient and his wife regarding silent strokes, small P-comm aneurysm and no new complaints of posterior neck pain and headache likely musculoskeletal in nature and answered questions. I recommend he continue Plavix for stroke prevention and maintenance control of hypertension with blood pressure goal below 130/90, lipids with LDL goal below 70 mg percent and diabetes with hemoglobin A1c goal below 6.5%. I  recommend he do regular neck stretching exercises for his musculoskeletal neck pain and increase participation in activities for stress laxation. Also consider application of local heat for relief. Check follow-up CT angiogram of the brain to follow his posterior communicating artery aneurysm. Return for follow-up in a year or call earlier if necessaryGreater than 50% time during this 25 minute visit was spent on counseling and coordination of care about his posterior head and neck pain, dizziness and asymptomatic cerebral aneurysm and answering questions Delia HeadyPramod Ersa Delaney, MD  [facility] [address] [place], [place] [id number]  Phone [phone] Fax [phone] Note: This document was prepared with digital dictation and possible smart phrase technology. Any transcriptional errors that result from this process are unintentional.

## 2017-12-14 ENCOUNTER — Ambulatory Visit
Admission: RE | Admit: 2017-12-14 | Discharge: 2017-12-14 | Disposition: A | Discharge status: Home / Self Care | Payer: Medicare Other | Source: Ambulatory Visit | Attending: Neurology | Admitting: Neurology

## 2017-12-14 DIAGNOSIS — I671 Cerebral aneurysm, nonruptured: Secondary | ICD-10-CM

## 2017-12-14 MED ORDER — IOPAMIDOL (ISOVUE-370) INJECTION 76%
75.0000 mL | Freq: Once | INTRAVENOUS | Status: AC | PRN
  Administered 2017-12-14: 75 mL via INTRAVENOUS

## 2017-12-20 ENCOUNTER — Telehealth: Payer: Self-pay | Admitting: Neurology

## 2017-12-20 NOTE — Telephone Encounter (Signed)
Notes recorded by Hildred AlaminMurrell, Katrina Y, RN on 12/20/2017 at 4:47 PM EDT Rn call patients wife about her husbands CI angio head report. Rn stated to wife that the Ct angiogram shows stable appearance of small left posterior communicating artery aneurysm. No new or worrisome findings. PT s wife verbalized understanding. ------

## 2017-12-20 NOTE — Telephone Encounter (Signed)
Patient's wife calling to get CT results. I advised Dr. Pearlean BrownieSethi is working in the hospital but will send message to his nurse.

## 2017-12-20 NOTE — Telephone Encounter (Signed)
Kindly inform the patient that CT angiogram of the brain shows stable appearance of the small left posterior communicating artery aneurysm. No new or worrisome findings

## 2018-01-09 ENCOUNTER — Other Ambulatory Visit: Payer: Self-pay

## 2018-01-09 DIAGNOSIS — R42 Dizziness and giddiness: Secondary | ICD-10-CM

## 2018-01-09 MED ORDER — CLOPIDOGREL BISULFATE 75 MG PO TABS: 75.0000 mg | ORAL_TABLET | Freq: Every day | ORAL | 3 refills | Status: DC

## 2018-03-01 ENCOUNTER — Other Ambulatory Visit: Payer: Self-pay | Admitting: Internal Medicine

## 2018-03-01 DIAGNOSIS — M79604 Pain in right leg: Secondary | ICD-10-CM

## 2018-03-13 ENCOUNTER — Ambulatory Visit
Admission: RE | Admit: 2018-03-13 | Discharge: 2018-03-13 | Disposition: A | Discharge status: Home / Self Care | Payer: Medicare Other | Source: Ambulatory Visit | Attending: Internal Medicine | Admitting: Internal Medicine

## 2018-03-13 DIAGNOSIS — M79604 Pain in right leg: Secondary | ICD-10-CM

## 2018-04-22 ENCOUNTER — Encounter (HOSPITAL_COMMUNITY): Payer: Self-pay | Admitting: *Deleted

## 2018-04-22 ENCOUNTER — Emergency Department (HOSPITAL_COMMUNITY)
Admission: EM | Admit: 2018-04-22 | Discharge: 2018-04-22 | Disposition: A | Discharge status: Home / Self Care | Payer: Medicare Other | Attending: Emergency Medicine | Admitting: Emergency Medicine

## 2018-04-22 ENCOUNTER — Other Ambulatory Visit: Payer: Self-pay

## 2018-04-22 ENCOUNTER — Emergency Department (HOSPITAL_COMMUNITY): Payer: Medicare Other

## 2018-04-22 DIAGNOSIS — Z79899 Other long term (current) drug therapy: Secondary | ICD-10-CM | POA: Insufficient documentation

## 2018-04-22 DIAGNOSIS — I10 Essential (primary) hypertension: Secondary | ICD-10-CM | POA: Diagnosis not present

## 2018-04-22 DIAGNOSIS — M545 Low back pain: Secondary | ICD-10-CM | POA: Diagnosis present

## 2018-04-22 DIAGNOSIS — Z7902 Long term (current) use of antithrombotics/antiplatelets: Secondary | ICD-10-CM | POA: Diagnosis not present

## 2018-04-22 DIAGNOSIS — E119 Type 2 diabetes mellitus without complications: Secondary | ICD-10-CM | POA: Insufficient documentation

## 2018-04-22 DIAGNOSIS — M5441 Lumbago with sciatica, right side: Secondary | ICD-10-CM | POA: Diagnosis not present

## 2018-04-22 DIAGNOSIS — Z7984 Long term (current) use of oral hypoglycemic drugs: Secondary | ICD-10-CM | POA: Insufficient documentation

## 2018-04-22 MED ORDER — CYCLOBENZAPRINE HCL 10 MG PO TABS: 10.0000 mg | ORAL_TABLET | Freq: Two times a day (BID) | ORAL | 0 refills | Status: DC | PRN

## 2018-04-22 MED ORDER — MELOXICAM 7.5 MG PO TABS: 7.5000 mg | ORAL_TABLET | Freq: Every day | ORAL | 0 refills | Status: DC

## 2018-04-22 MED ORDER — LIDOCAINE 5 % EX PTCH: 1.0000 | MEDICATED_PATCH | CUTANEOUS | 0 refills | Status: DC

## 2018-04-22 MED ORDER — OXYCODONE-ACETAMINOPHEN 5-325 MG PO TABS
2.0000 | ORAL_TABLET | Freq: Once | ORAL | Status: AC
  Administered 2018-04-22: 2 via ORAL
  Filled 2018-04-22: qty 2

## 2018-04-22 NOTE — ED Triage Notes (Signed)
Pt has nontraumatic pain in rt leg for over a week, hurts to walk or apply pressure when lying on it in bed. No noted swelling, skin warm and dry.

## 2018-04-22 NOTE — ED Provider Notes (Signed)
[facility]-EMERGENCY DEPT Provider Note   CSN: [id number] Arrival date & time: 04/22/18  0751     History   Chief Complaint Chief Complaint  Patient presents with  . Leg Pain    rt    HPI [name] is a 77 y.o. male.  HPI 77 year old male history of diabetes, GERD, hypertension presents today complaining 1 week history of right low back pain radiating down to his leg.  No known precipitating event that started this.  It has gotten worse over the week.  Pain is 7 out of 10 when he is upright.  Is 10 out of 10 when he is laying down.  He has taken Tylenol without relief.  He denies any loss of bowel or bladder control, weakness, numbness, or tingling.  The pain is worse when he tries to put weight on it and walk.  He has no known history of cancer.  He has had prior back surgery in the distant past.  Pain has been constant although it waxes and wanes.  Denies any fever, chills, abdominal pain, urinary tract infection symptoms. Past Medical History:  Diagnosis Date  . Diabetes mellitus without complication (HCC)   . GERD (gastroesophageal reflux disease) 12/26/2016  . Hypertension     Patient Active Problem List   Diagnosis Date Noted  . CVA (cerebral vascular accident) (HCC) 12/27/2016  . Vertigo 12/27/2016  . Dizziness 12/26/2016  . GERD (gastroesophageal reflux disease) 12/26/2016  . Unsteady gait   . Benign essential HTN 10/11/2015  . Leukocytosis 10/11/2015  . ARF (acute renal failure) (HCC) 10/11/2015  . Hypokalemia 10/11/2015  . Controlled type 2 diabetes mellitus without complication, without long-term current use of insulin (HCC) 10/11/2015  . Sepsis secondary to UTI (HCC) 10/11/2015    Past Surgical History:  Procedure Laterality Date  . BACK SURGERY    . COLONOSCOPY WITH PROPOFOL N/A 11/18/2014   Procedure: COLONOSCOPY WITH PROPOFOL;  Surgeon: Charolett Bumpers, MD;  Location: [facility]COPY;  Service: Endoscopy;  Laterality: N/A;  .  ROTATOR CUFF REPAIR     bilateral shoulder  . THROAT SURGERY     benign tumor removed        Home Medications    Prior to Admission medications   Medication Sig Start Date End Date Taking? Authorizing Provider  acetaminophen (TYLENOL) 500 MG tablet Take 1,000 mg by mouth every 6 (six) hours as needed for headache (pain).    [provider]  ALPRAZolam Prudy Feeler) 0.5 MG tablet Take 0.25 mg by mouth at bedtime. 09/28/16   [provider]  amLODipine (NORVASC) 10 MG tablet Take 10 mg by mouth daily.     [provider]  clopidogrel (PLAVIX) 75 MG tablet Take 1 tablet (75 mg total) by mouth daily. 01/09/18   Micki Riley, MD  glimepiride (AMARYL) 4 MG tablet Take 4 mg by mouth daily.    [provider]  losartan-hydrochlorothiazide (HYZAAR) 50-12.5 MG tablet Take 1 tablet by mouth daily. 10/09/15   [provider]  meclizine (ANTIVERT) 25 MG tablet Take 1 tablet (25 mg total) by mouth 3 (three) times daily as needed for dizziness. 12/28/16   Maxie Barb, MD  metFORMIN (GLUCOPHAGE) 500 MG tablet Take 500 mg by mouth 2 (two) times daily with a meal.    [provider]  Multiple Vitamin (MULTIVITAMIN WITH MINERALS) TABS Take 1 tablet by mouth daily.    [provider]  pioglitazone (ACTOS) 15 MG tablet Take  15 mg by mouth daily.    [provider]    Family History Family History  Problem Relation Age of Onset  . Heart failure Mother   . Dementia Mother     Social History Social History   Tobacco Use  . Smoking status: Never Smoker  . Smokeless tobacco: Never Used  Substance Use Topics  . Alcohol use: No  . Drug use: No     Allergies   Patient has no known allergies.   Review of Systems Review of Systems  All other systems reviewed and are negative.    Physical Exam Updated Vital Signs BP (!) 181/78 (BP Location: Left Arm)   Pulse 91   Temp 98 F (36.7 C) (Oral)   Resp 18   Ht 1.854 m  (6\' 1" )   Wt 83.9 kg (185 lb)   SpO2 98%   BMI 24.41 kg/m   Physical Exam  Constitutional: He is oriented to person, place, and time. He appears well-developed and well-nourished.  HENT:  Head: Normocephalic and atraumatic.  Right Ear: External ear normal.  Left Ear: External ear normal.  Eyes: Pupils are equal, round, and reactive to light. EOM are normal.  Neck: Normal range of motion.  Cardiovascular: Normal rate.  Pulmonary/Chest: Effort normal.  Abdominal: Soft.  Musculoskeletal: Normal range of motion. He exhibits tenderness. He exhibits no edema or deformity.  Mild ttp right buttock and hip No visual abnormality- no swelling, redness, fluctuance Pulses intact distally-bilateral dp, radial, and femoral   Neurological: He is alert and oriented to person, place, and time. He displays normal reflexes. A cranial nerve deficit is present. No sensory deficit. He exhibits normal muscle tone. Coordination normal.  Skin: Skin is warm and dry. Capillary refill takes less than 2 seconds.  Psychiatric: He has a normal mood and affect.  Nursing note and vitals reviewed.    ED Treatments / Results  Labs (all labs ordered are listed, but only abnormal results are displayed) Labs Reviewed - No data to display  EKG None  Radiology Dg Lumbar Spine 2-3 Views  Result Date: 04/22/2018 CLINICAL DATA:  Back and right leg pain for 1 week, no known injury, initial encounter EXAM: LUMBAR SPINE - 3 VIEW COMPARISON:  10/11/2013 FINDINGS: Five lumbar type vertebral bodies are well visualized. Changes of prior fusion at L5-S1 are seen. Multilevel osteophytic changes are noted. No vertebral body height loss is noted. Mild aortic calcifications are seen. IMPRESSION: Degenerative and postsurgical changes without acute abnormality. Electronically Signed   By: Alcide CleverMark  Lukens M.D.   On: 04/22/2018 08:46   Dg Hip Unilat W Or Wo Pelvis 2-3 Views Right  Result Date: 04/22/2018 CLINICAL DATA:  Leg pain for 1  week, no known injury, initial encounter EXAM: DG HIP (WITH OR WITHOUT PELVIS) 3V RIGHT COMPARISON:  None. FINDINGS: Pelvic ring is intact. Degenerative changes of the hip joints are noted bilaterally. Postsurgical changes in lower lumbar spine are seen. No acute fracture or dislocation is noted. No soft tissue changes are seen. IMPRESSION: Chronic change without acute abnormality. Electronically Signed   By: Alcide CleverMark  Lukens M.D.   On: 04/22/2018 08:45    Procedures Procedures (including critical care time)  Medications Ordered in ED Medications  oxyCODONE-acetaminophen (PERCOCET/ROXICET) 5-325 MG per tablet 2 tablet (has no administration in time range)     Initial Impression / Assessment and Plan / ED Course  I have reviewed the triage vital signs and the nursing notes.  Pertinent labs & imaging  results that were available during my care of the patient were reviewed by me and considered in my medical decision making (see chart for details).     Patient without acute graphic findings of fracture or other abnormality.  He does have chronic changes. Patient treated here with Percocet and feels somewhat improved.  Plan Flexeril anti-inflammatories and lidocaine patches.  We discussed return precautions and need for follow-up and he voices understanding.  I discussed his care with the patient, his wife, and his daughter.  They voiced understanding of the plan and specifically of return precautions of weakness, loss of bowel or bladder control or other worsening symptoms. Final Clinical Impressions(s) / ED Diagnoses   Final diagnoses:  Acute right-sided low back pain with right-sided sciatica    ED Discharge Orders    None       Margarita Grizzle, MD 04/22/18 [phone]

## 2018-04-25 ENCOUNTER — Other Ambulatory Visit: Payer: Self-pay | Admitting: Internal Medicine

## 2018-04-25 DIAGNOSIS — M5430 Sciatica, unspecified side: Secondary | ICD-10-CM

## 2018-04-25 DIAGNOSIS — M79604 Pain in right leg: Secondary | ICD-10-CM

## 2018-05-01 ENCOUNTER — Ambulatory Visit
Admission: RE | Admit: 2018-05-01 | Discharge: 2018-05-01 | Disposition: A | Discharge status: Home / Self Care | Payer: Medicare Other | Source: Ambulatory Visit | Attending: Internal Medicine | Admitting: Internal Medicine

## 2018-05-01 DIAGNOSIS — M79604 Pain in right leg: Secondary | ICD-10-CM

## 2018-05-01 DIAGNOSIS — M5430 Sciatica, unspecified side: Secondary | ICD-10-CM

## 2018-05-11 ENCOUNTER — Other Ambulatory Visit: Payer: Self-pay | Admitting: Neurosurgery

## 2018-05-15 ENCOUNTER — Telehealth: Payer: Self-pay

## 2018-05-15 NOTE — Telephone Encounter (Signed)
Rn fax clearance form to [facility] at [phone]. Form fax twice and receive.

## 2018-05-18 NOTE — Pre-Procedure Instructions (Signed)
[name]  05/18/2018      [facility] - [place], [place] - [id number], [address] [place] [address] Phone: [phone] Fax: [phone]  [id number] - [place], [place] - [address] [place] [address] Phone: [phone] Fax: [phone]    Your procedure is scheduled on September 9  Report to [facility] Admitting at [phone] A.M.  Call this number if you have problems the morning of surgery:  [phone]   Remember:  Do not eat or drink after midnight.    Take these medicines the morning of surgery with A SIP OF WATER  amLODipine (NORVASC) meclizine (ANTIVERT ranitidine (ZANTAC)  7 days prior to surgery STOP taking any Aspirin(unless otherwise instructed by your surgeon), Aleve, Naproxen, Ibuprofen, Motrin, Advil, Goody's, BC's, all herbal medications, fish oil, and all vitamins  FOLLOW PHYSICIANS INSTRUCTIONS ABOUT PLAVIX   WHAT DO I DO ABOUT MY DIABETES MEDICATION?   Marland Kitchen Do not take oral diabetes medicines (pills) the morning of surgery. glimepiride (AMARYL), metFORMIN (GLUCOPHAGE),  pioglitazone (ACTOS)   How to Manage Your Diabetes Before and After Surgery  Why is it important to control my blood sugar before and after surgery? . Improving blood sugar levels before and after surgery helps healing and can limit problems. . A way of improving blood sugar control is eating a healthy diet by: o  Eating less sugar and carbohydrates o  Increasing activity/exercise o  Talking with your doctor about reaching your blood sugar goals . High blood sugars (greater than 180 mg/dL) can raise your risk of infections and slow your recovery, so you will need to focus on controlling your diabetes during the weeks before surgery. . Make sure that the doctor who takes care of your diabetes knows about your planned surgery including the date and location.  How do I manage my blood  sugar before surgery? . Check your blood sugar at least 4 times a day, starting 2 days before surgery, to make sure that the level is not too high or low. o Check your blood sugar the morning of your surgery when you wake up and every 2 hours until you get to the Short Stay unit. . If your blood sugar is less than 70 mg/dL, you will need to treat for low blood sugar: o Do not take insulin. o Treat a low blood sugar (less than 70 mg/dL) with  cup of clear juice (cranberry or apple), 4 glucose tablets, OR glucose gel. o Recheck blood sugar in 15 minutes after treatment (to make sure it is greater than 70 mg/dL). If your blood sugar is not greater than 70 mg/dL on recheck, call [phone] for further instructions. . Report your blood sugar to the short stay nurse when you get to Short Stay.  . If you are admitted to the hospital after surgery: o Your blood sugar will be checked by the staff and you will probably be given insulin after surgery (instead of oral diabetes medicines) to make sure you have good blood sugar levels. o The goal for blood sugar control after surgery is 80-180 mg/dL.    Do not wear jewelry  Do not wear lotions, powders, or cologne, or deodorant.  Men may shave face and neck.  Do not bring valuables to the hospital.  [facility] is not responsible for any belongings or valuables.  Contacts, dentures or bridgework may not be worn into surgery.  Leave your suitcase in the car.  After surgery it may be brought to your room.  For patients admitted to the hospital, discharge time will be determined by your treatment team.  Patients discharged the day of surgery will not be allowed to drive home.    Special instructions:   [facility]- Preparing For Surgery  Before surgery, you can play an important role. Because skin is not sterile, your skin needs to be as free of germs as possible. You can reduce the number of germs on your skin by washing with CHG (chlorahexidine  gluconate) Soap before surgery.  CHG is an antiseptic cleaner which kills germs and bonds with the skin to continue killing germs even after washing.    Oral Hygiene is also important to reduce your risk of infection.  Remember - BRUSH YOUR TEETH THE MORNING OF SURGERY WITH YOUR REGULAR TOOTHPASTE  Please do not use if you have an allergy to CHG or antibacterial soaps. If your skin becomes reddened/irritated stop using the CHG.  Do not shave (including legs and underarms) for at least 48 hours prior to first CHG shower. It is OK to shave your face.  Please follow these instructions carefully.   1. Shower the NIGHT BEFORE SURGERY and the MORNING OF SURGERY with CHG.   2. If you chose to wash your hair, wash your hair first as usual with your normal shampoo.  3. After you shampoo, rinse your hair and body thoroughly to remove the shampoo.  4. Use CHG as you would any other liquid soap. You can apply CHG directly to the skin and wash gently with a scrungie or a clean washcloth.   5. Apply the CHG Soap to your body ONLY FROM THE NECK DOWN.  Do not use on open wounds or open sores. Avoid contact with your eyes, ears, mouth and genitals (private parts). Wash Face and genitals (private parts)  with your normal soap.  6. Wash thoroughly, paying special attention to the area where your surgery will be performed.  7. Thoroughly rinse your body with warm water from the neck down.  8. DO NOT shower/wash with your normal soap after using and rinsing off the CHG Soap.  9. Pat yourself dry with a CLEAN TOWEL.  10. Wear CLEAN PAJAMAS to bed the night before surgery, wear comfortable clothes the morning of surgery  11. Place CLEAN SHEETS on your bed the night of your first shower and DO NOT SLEEP WITH PETS.    Day of Surgery:  Do not apply any deodorants/lotions.  Please wear clean clothes to the hospital/surgery center.   Remember to brush your teeth WITH YOUR REGULAR TOOTHPASTE.    Please  read over the following fact sheets that you were given.

## 2018-05-22 ENCOUNTER — Encounter (HOSPITAL_COMMUNITY): Payer: Self-pay

## 2018-05-22 ENCOUNTER — Encounter (HOSPITAL_COMMUNITY)
Admission: RE | Admit: 2018-05-22 | Discharge: 2018-05-22 | Disposition: A | Discharge status: Home / Self Care | Payer: Medicare Other | Source: Ambulatory Visit | Attending: Neurosurgery | Admitting: Neurosurgery

## 2018-05-22 ENCOUNTER — Other Ambulatory Visit: Payer: Self-pay

## 2018-05-22 DIAGNOSIS — E119 Type 2 diabetes mellitus without complications: Secondary | ICD-10-CM | POA: Diagnosis not present

## 2018-05-22 DIAGNOSIS — I10 Essential (primary) hypertension: Secondary | ICD-10-CM | POA: Insufficient documentation

## 2018-05-22 DIAGNOSIS — Z01810 Encounter for preprocedural cardiovascular examination: Secondary | ICD-10-CM | POA: Insufficient documentation

## 2018-05-22 DIAGNOSIS — Z01812 Encounter for preprocedural laboratory examination: Secondary | ICD-10-CM | POA: Diagnosis not present

## 2018-05-22 HISTORY — DX: Cerebral infarction, unspecified: I63.9

## 2018-05-22 HISTORY — DX: Personal history of urinary calculi: Z87.442

## 2018-05-22 HISTORY — DX: Unspecified osteoarthritis, unspecified site: M19.90

## 2018-05-22 LAB — CBC
HCT: 46.8 % (ref 39.0–52.0)
Hemoglobin: 15.9 g/dL (ref 13.0–17.0)
MCH: 30.6 pg (ref 26.0–34.0)
MCHC: 34 g/dL (ref 30.0–36.0)
MCV: 90 fL (ref 78.0–100.0)
Platelets: 219 10*3/uL (ref 150–400)
RBC: 5.2 MIL/uL (ref 4.22–5.81)
RDW: 13 % (ref 11.5–15.5)
WBC: 7.6 10*3/uL (ref 4.0–10.5)

## 2018-05-22 LAB — TYPE AND SCREEN
ABO/RH(D): O POS
Antibody Screen: NEGATIVE

## 2018-05-22 LAB — HEMOGLOBIN A1C
Hgb A1c MFr Bld: 7.3 % — ABNORMAL HIGH (ref 4.8–5.6)
Mean Plasma Glucose: 162.81 mg/dL

## 2018-05-22 LAB — GLUCOSE, CAPILLARY: Glucose-Capillary: 242 mg/dL — ABNORMAL HIGH (ref 70–99)

## 2018-05-22 LAB — BASIC METABOLIC PANEL
Anion gap: 9 (ref 5–15)
BUN: 13 mg/dL (ref 8–23)
CO2: 30 mmol/L (ref 22–32)
Calcium: 9.8 mg/dL (ref 8.9–10.3)
Chloride: 101 mmol/L (ref 98–111)
Creatinine, Ser: 1.07 mg/dL (ref 0.61–1.24)
GFR calc Af Amer: >60 mL/min (ref >60)
GFR calc non Af Amer: >60 mL/min (ref >60)
Glucose, Bld: 203 mg/dL — ABNORMAL HIGH (ref 70–99)
Potassium: 3.7 mmol/L (ref 3.5–5.1)
Sodium: 140 mmol/L (ref 135–145)

## 2018-05-22 LAB — SURGICAL PCR SCREEN
MRSA, PCR: NEGATIVE
Staphylococcus aureus: NEGATIVE

## 2018-05-22 NOTE — Progress Notes (Addendum)
PCP: Dr. Valentina Lucks Cardiologist: denies  EKG: 05-22-18 Chest Xray: denies Echo: 2018 Stress Test: denies Cardiac Cath: denies  Pt with diabetes does not check sugars at home. Does not own meter. For breakfast this A.M., patient had a banana, bowl of cheerios, glass of milk, and a cup of coffee.  Denies shortness of breath, cough, fever, chest pain at PAT appointment.   Requested patient to review materials prior to surgery.

## 2018-05-23 ENCOUNTER — Other Ambulatory Visit: Payer: Self-pay | Admitting: Neurosurgery

## 2018-05-23 NOTE — Progress Notes (Signed)
Anesthesia Chart Review:  Case:  [id number] Date/Time:  05/28/18 1036   Procedure:  LUMBAR 4- LUMBAR 5 DECOMPRESSION, POSTERIOR LUMBAR INTERBODY FUSION AND POSTERIOR LATERAL ARTHRODESIS (Bilateral Back) - LUMBAR 4- LUMBAR 5 DECOMPRESSION, POSTERIOR LUMBAR INTERBODY FUSION AND POSTERIOR LATERAL ARTHRODESIS   Anesthesia type:  General   Pre-op diagnosis:  LUMBAR STENOSIS WITH NEUROGENIC CLAUDICATION   Location:  [facility] ROOM 20 / [facility]   Surgeon:  Shirlean Kelly, MD      DISCUSSION: 77 yo male never smoker for above procedure. Pertinent hx includes DMII, HTN, GERD CVA (2018).  Asked to review chart for abnormal EKG. Multiple tracings taken, per PAT nurse pt had difficult time staying still during recording leading to significant artifact. EKG in Epic dated 05/23/2018 with baseline artifact and computer read of accelerated junctional rhythm. There is a better tracing on pt chart from same date showing Sinus rhythm with obvious p-waves.  Pt has clearance from neurology to stop Plavix. Per Lowella Bandy at [facility], Dr. Newell Coral instructed pt to stop Plavix on 05/11/2018   Anticipate he can proceed with surgery as planned barring acute status change.  VS: BP (!) 151/71   Pulse 85   Temp 36.6 C   Resp 20   Ht 6\' 1"  (1.854 m)   Wt 82.8 kg   SpO2 98%   BMI 24.09 kg/m   PROVIDERS: Kirby Funk, MD is PCP  Delia Heady, MD is Neurologist   LABS: Labs reviewed: Acceptable for surgery. (all labs ordered are listed, but only abnormal results are displayed)  Labs Reviewed  GLUCOSE, CAPILLARY - Abnormal; Notable for the following components:      Result Value   Glucose-Capillary 242 (*)    All other components within normal limits  HEMOGLOBIN A1C - Abnormal; Notable for the following components:   Hgb A1c MFr Bld 7.3 (*)    All other components within normal limits  BASIC METABOLIC PANEL - Abnormal; Notable for the following components:   Glucose, Bld 203 (*)    All other components  within normal limits  SURGICAL PCR SCREEN  CBC  TYPE AND SCREEN     IMAGES: CHEST  2 VIEW 12/27/2016  COMPARISON:  10/11/2015  FINDINGS: Normal heart size, mediastinal contours, and pulmonary vascularity.  Atherosclerotic calcification aorta.  Lungs clear.  No pleural effusion or pneumothorax.  Bones demineralized.  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  IMPRESSION: No acute abnormalities.  Aortic atherosclerosis.   EKG: 05/22/2018 (tracing on pt chart): NSR. Junctional ST depression, probably normal.  CV: Echo 12/28/2016: Indications:      CVA 436.  ------------------------------------------------------------------- History:   PMH:  GERD.  Risk factors:  Hypertension. Diabetes mellitus.  ------------------------------------------------------------------- Study Conclusions  - Left ventricle: The cavity size was normal. There was moderate   basal hypertrophy of the septum with mild posterior wall   hypertrophy. Systolic function was normal. The estimated ejection   fraction was in the range of 55% to 60%. Wall motion was normal;   there were no regional wall motion abnormalities. Doppler   parameters are consistent with abnormal left ventricular   relaxation (grade 1 diastolic dysfunction). Doppler parameters   are consistent with indeterminate ventricular filling pressure. - Aortic valve: Transvalvular velocity was within the normal range.   There was no stenosis. There was no regurgitation. - Mitral valve: Transvalvular velocity was within the normal range.   There was no evidence for stenosis. There was no regurgitation. - Right ventricle: The cavity size  was normal. Wall thickness was   normal. Systolic function was normal. - Atrial septum: No defect or patent foramen ovale was identified   by color flow Doppler. - Tricuspid valve: There was trivial regurgitation. - Pulmonary arteries: Systolic pressure was within the  normal   range. PA peak pressure: 31 mm Hg (S).  Past Medical History:  Diagnosis Date  . Arthritis   . Diabetes mellitus without complication (HCC)   . GERD (gastroesophageal reflux disease) 12/26/2016  . History of kidney stones    Lithotripsy  . Hypertension   . Stroke [facility])    Light Stroke during episode of vertigo - April 2018    Past Surgical History:  Procedure Laterality Date  . BACK SURGERY    . COLONOSCOPY WITH PROPOFOL N/A 11/18/2014   Procedure: COLONOSCOPY WITH PROPOFOL;  Surgeon: Charolett Bumpers, MD;  Location: [facility];  Service: Endoscopy;  Laterality: N/A;  . LITHOTRIPSY    . ROTATOR CUFF REPAIR     bilateral shoulder  . THROAT SURGERY     benign tumor removed    MEDICATIONS: . ALPRAZolam (XANAX) 0.5 MG tablet  . amLODipine (NORVASC) 10 MG tablet  . clopidogrel (PLAVIX) 75 MG tablet  . cyclobenzaprine (FLEXERIL) 10 MG tablet  . glimepiride (AMARYL) 1 MG tablet  . lidocaine (LIDODERM) 5 %  . losartan-hydrochlorothiazide (HYZAAR) 50-12.5 MG tablet  . meclizine (ANTIVERT) 25 MG tablet  . meloxicam (MOBIC) 7.5 MG tablet  . metFORMIN (GLUCOPHAGE) 500 MG tablet  . Multiple Vitamin (MULTIVITAMIN WITH MINERALS) TABS  . pioglitazone (ACTOS) 15 MG tablet  . ranitidine (ZANTAC) 150 MG tablet   No current facility-administered medications for this encounter.      Zannie Cove [facility] Short Stay Center/Anesthesiology Phone [phone] 05/23/2018 10:41 AM

## 2018-05-28 ENCOUNTER — Inpatient Hospital Stay (HOSPITAL_COMMUNITY): Payer: Medicare Other | Admitting: Physician Assistant

## 2018-05-28 ENCOUNTER — Inpatient Hospital Stay (HOSPITAL_COMMUNITY): Payer: Medicare Other

## 2018-05-28 ENCOUNTER — Other Ambulatory Visit: Payer: Self-pay

## 2018-05-28 ENCOUNTER — Inpatient Hospital Stay (HOSPITAL_COMMUNITY): Payer: Medicare Other | Admitting: Certified Registered Nurse Anesthetist

## 2018-05-28 ENCOUNTER — Inpatient Hospital Stay (HOSPITAL_COMMUNITY)
Admission: RE | Admit: 2018-05-28 | Discharge: 2018-05-29 | DRG: 455 | Disposition: A | Discharge status: Home / Self Care | Payer: Medicare Other | Source: Ambulatory Visit | Attending: Neurosurgery | Admitting: Neurosurgery

## 2018-05-28 ENCOUNTER — Inpatient Hospital Stay (HOSPITAL_COMMUNITY)
Admission: RE | Disposition: A | Discharge status: Home / Self Care | Payer: Self-pay | Source: Ambulatory Visit | Attending: Neurosurgery

## 2018-05-28 DIAGNOSIS — Z8673 Personal history of transient ischemic attack (TIA), and cerebral infarction without residual deficits: Secondary | ICD-10-CM | POA: Diagnosis not present

## 2018-05-28 DIAGNOSIS — Z419 Encounter for procedure for purposes other than remedying health state, unspecified: Secondary | ICD-10-CM

## 2018-05-28 DIAGNOSIS — I10 Essential (primary) hypertension: Secondary | ICD-10-CM | POA: Diagnosis present

## 2018-05-28 DIAGNOSIS — M48062 Spinal stenosis, lumbar region with neurogenic claudication: Secondary | ICD-10-CM | POA: Diagnosis present

## 2018-05-28 DIAGNOSIS — M4726 Other spondylosis with radiculopathy, lumbar region: Secondary | ICD-10-CM | POA: Diagnosis present

## 2018-05-28 DIAGNOSIS — M5116 Intervertebral disc disorders with radiculopathy, lumbar region: Principal | ICD-10-CM | POA: Diagnosis present

## 2018-05-28 DIAGNOSIS — K219 Gastro-esophageal reflux disease without esophagitis: Secondary | ICD-10-CM | POA: Diagnosis present

## 2018-05-28 DIAGNOSIS — E119 Type 2 diabetes mellitus without complications: Secondary | ICD-10-CM | POA: Diagnosis present

## 2018-05-28 LAB — GLUCOSE, CAPILLARY
Glucose-Capillary: 130 mg/dL — ABNORMAL HIGH (ref 70–99)
Glucose-Capillary: 181 mg/dL — ABNORMAL HIGH (ref 70–99)
Glucose-Capillary: 312 mg/dL — ABNORMAL HIGH (ref 70–99)

## 2018-05-28 SURGERY — POSTERIOR LUMBAR FUSION 1 LEVEL
Anesthesia: General | Site: Back | Laterality: Bilateral

## 2018-05-28 MED ORDER — DEXAMETHASONE SODIUM PHOSPHATE 10 MG/ML IJ SOLN
INTRAMUSCULAR | Status: DC | PRN
  Administered 2018-05-28: 10 mg via INTRAVENOUS

## 2018-05-28 MED ORDER — MAGNESIUM HYDROXIDE 400 MG/5ML PO SUSP: 30.0000 mL | Freq: Every day | ORAL | Status: DC | PRN

## 2018-05-28 MED ORDER — ACETAMINOPHEN 650 MG RE SUPP: 650.0000 mg | RECTAL | Status: DC | PRN

## 2018-05-28 MED ORDER — GLIMEPIRIDE 1 MG PO TABS
1.0000 mg | ORAL_TABLET | Freq: Every day | ORAL | Status: DC
  Administered 2018-05-29: 1 mg via ORAL
  Filled 2018-05-28: qty 1

## 2018-05-28 MED ORDER — ROCURONIUM BROMIDE 50 MG/5ML IV SOSY
PREFILLED_SYRINGE | INTRAVENOUS | Status: AC
  Filled 2018-05-28: qty 5

## 2018-05-28 MED ORDER — THROMBIN 20000 UNITS EX SOLR
CUTANEOUS | Status: DC | PRN
  Administered 2018-05-28: 12:00:00 via TOPICAL

## 2018-05-28 MED ORDER — FLEET ENEMA 7-19 GM/118ML RE ENEM: 1.0000 | ENEMA | Freq: Once | RECTAL | Status: DC | PRN

## 2018-05-28 MED ORDER — CEFAZOLIN SODIUM-DEXTROSE 2-4 GM/100ML-% IV SOLN
2.0000 g | INTRAVENOUS | Status: AC
  Administered 2018-05-28 (×2): 2 g via INTRAVENOUS
  Filled 2018-05-28: qty 100

## 2018-05-28 MED ORDER — CEFAZOLIN SODIUM-DEXTROSE 2-4 GM/100ML-% IV SOLN
INTRAVENOUS | Status: AC
  Filled 2018-05-28: qty 100

## 2018-05-28 MED ORDER — SUGAMMADEX SODIUM 200 MG/2ML IV SOLN
INTRAVENOUS | Status: DC | PRN
  Administered 2018-05-28: 330 mg via INTRAVENOUS

## 2018-05-28 MED ORDER — ROCURONIUM BROMIDE 10 MG/ML (PF) SYRINGE
PREFILLED_SYRINGE | INTRAVENOUS | Status: DC | PRN
  Administered 2018-05-28: 50 mg via INTRAVENOUS
  Administered 2018-05-28: 20 mg via INTRAVENOUS
  Administered 2018-05-28: 10 mg via INTRAVENOUS
  Administered 2018-05-28: 20 mg via INTRAVENOUS
  Administered 2018-05-28 (×2): 10 mg via INTRAVENOUS
  Administered 2018-05-28: 20 mg via INTRAVENOUS

## 2018-05-28 MED ORDER — HYDROXYZINE HCL 50 MG/ML IM SOLN: 50.0000 mg | INTRAMUSCULAR | Status: DC | PRN

## 2018-05-28 MED ORDER — BISACODYL 10 MG RE SUPP: 10.0000 mg | Freq: Every day | RECTAL | Status: DC | PRN

## 2018-05-28 MED ORDER — SODIUM CHLORIDE 0.9 % IV SOLN: 250.0000 mL | INTRAVENOUS | Status: DC

## 2018-05-28 MED ORDER — LIDOCAINE-EPINEPHRINE 1 %-1:100000 IJ SOLN
INTRAMUSCULAR | Status: DC | PRN
  Administered 2018-05-28: 8 mL via INTRADERMAL

## 2018-05-28 MED ORDER — SODIUM CHLORIDE 0.9% FLUSH
3.0000 mL | Freq: Two times a day (BID) | INTRAVENOUS | Status: DC
  Administered 2018-05-29: 3 mL via INTRAVENOUS

## 2018-05-28 MED ORDER — ALPRAZOLAM 0.25 MG PO TABS
0.2500 mg | ORAL_TABLET | Freq: Every day | ORAL | Status: DC
  Administered 2018-05-28: 0.25 mg via ORAL
  Filled 2018-05-28: qty 1

## 2018-05-28 MED ORDER — CHLORHEXIDINE GLUCONATE CLOTH 2 % EX PADS: 6.0000 | MEDICATED_PAD | Freq: Once | CUTANEOUS | Status: DC

## 2018-05-28 MED ORDER — LIDOCAINE-EPINEPHRINE 1 %-1:100000 IJ SOLN
INTRAMUSCULAR | Status: AC
  Filled 2018-05-28: qty 1

## 2018-05-28 MED ORDER — ARTIFICIAL TEARS OPHTHALMIC OINT
TOPICAL_OINTMENT | OPHTHALMIC | Status: AC
  Filled 2018-05-28: qty 3.5

## 2018-05-28 MED ORDER — MORPHINE SULFATE (PF) 4 MG/ML IV SOLN: 4.0000 mg | INTRAVENOUS | Status: DC | PRN

## 2018-05-28 MED ORDER — LOSARTAN POTASSIUM-HCTZ 50-12.5 MG PO TABS: 1.0000 | ORAL_TABLET | Freq: Every day | ORAL | Status: DC

## 2018-05-28 MED ORDER — LIDOCAINE 2% (20 MG/ML) 5 ML SYRINGE
INTRAMUSCULAR | Status: DC | PRN
  Administered 2018-05-28: 60 mg via INTRAVENOUS

## 2018-05-28 MED ORDER — LACTATED RINGERS IV SOLN
INTRAVENOUS | Status: DC
  Administered 2018-05-28 (×3): via INTRAVENOUS

## 2018-05-28 MED ORDER — METFORMIN HCL 500 MG PO TABS
500.0000 mg | ORAL_TABLET | Freq: Two times a day (BID) | ORAL | Status: DC
  Administered 2018-05-28 – 2018-05-29 (×2): 500 mg via ORAL
  Filled 2018-05-28 (×2): qty 1

## 2018-05-28 MED ORDER — HYDROCHLOROTHIAZIDE 12.5 MG PO CAPS
12.5000 mg | ORAL_CAPSULE | Freq: Every day | ORAL | Status: DC
  Administered 2018-05-29: 12.5 mg via ORAL
  Filled 2018-05-28: qty 1

## 2018-05-28 MED ORDER — BUPIVACAINE HCL (PF) 0.5 % IJ SOLN
INTRAMUSCULAR | Status: AC
  Filled 2018-05-28: qty 30

## 2018-05-28 MED ORDER — THROMBIN 20000 UNITS EX KIT
PACK | CUTANEOUS | Status: AC
  Filled 2018-05-28: qty 1

## 2018-05-28 MED ORDER — ONDANSETRON HCL 4 MG/2ML IJ SOLN
INTRAMUSCULAR | Status: AC
  Filled 2018-05-28: qty 2

## 2018-05-28 MED ORDER — BUPIVACAINE HCL (PF) 0.5 % IJ SOLN
INTRAMUSCULAR | Status: DC | PRN
  Administered 2018-05-28: 8 mL

## 2018-05-28 MED ORDER — PHENOL 1.4 % MT LIQD
1.0000 | OROMUCOSAL | Status: DC | PRN
  Filled 2018-05-28: qty 177

## 2018-05-28 MED ORDER — KETOROLAC TROMETHAMINE 30 MG/ML IJ SOLN
15.0000 mg | Freq: Four times a day (QID) | INTRAMUSCULAR | Status: DC
  Administered 2018-05-28 – 2018-05-29 (×3): 15 mg via INTRAVENOUS
  Filled 2018-05-28 (×3): qty 1

## 2018-05-28 MED ORDER — HYDROCODONE-ACETAMINOPHEN 5-325 MG PO TABS
1.0000 | ORAL_TABLET | ORAL | Status: DC | PRN
  Administered 2018-05-28 – 2018-05-29 (×3): 1 via ORAL
  Filled 2018-05-28 (×2): qty 2
  Filled 2018-05-28: qty 1

## 2018-05-28 MED ORDER — FENTANYL CITRATE (PF) 250 MCG/5ML IJ SOLN
INTRAMUSCULAR | Status: AC
  Filled 2018-05-28: qty 5

## 2018-05-28 MED ORDER — OXYCODONE HCL 5 MG/5ML PO SOLN: 5.0000 mg | Freq: Once | ORAL | Status: DC | PRN

## 2018-05-28 MED ORDER — CYCLOBENZAPRINE HCL 5 MG PO TABS
5.0000 mg | ORAL_TABLET | Freq: Three times a day (TID) | ORAL | Status: DC | PRN
  Administered 2018-05-28 – 2018-05-29 (×2): 5 mg via ORAL
  Filled 2018-05-28 (×2): qty 1

## 2018-05-28 MED ORDER — AMLODIPINE BESYLATE 5 MG PO TABS
10.0000 mg | ORAL_TABLET | Freq: Every day | ORAL | Status: DC
  Administered 2018-05-29: 10 mg via ORAL
  Filled 2018-05-28: qty 2

## 2018-05-28 MED ORDER — KETOROLAC TROMETHAMINE 30 MG/ML IJ SOLN
15.0000 mg | Freq: Once | INTRAMUSCULAR | Status: AC
  Administered 2018-05-28: 15 mg via INTRAVENOUS

## 2018-05-28 MED ORDER — HYDROXYZINE HCL 25 MG PO TABS: 50.0000 mg | ORAL_TABLET | ORAL | Status: DC | PRN

## 2018-05-28 MED ORDER — ONDANSETRON HCL 4 MG/2ML IJ SOLN
INTRAMUSCULAR | Status: DC | PRN
  Administered 2018-05-28: 4 mg via INTRAVENOUS

## 2018-05-28 MED ORDER — PIOGLITAZONE HCL 15 MG PO TABS
15.0000 mg | ORAL_TABLET | Freq: Every day | ORAL | Status: DC
  Administered 2018-05-29: 15 mg via ORAL
  Filled 2018-05-28: qty 1

## 2018-05-28 MED ORDER — PROPOFOL 10 MG/ML IV BOLUS
INTRAVENOUS | Status: AC
  Filled 2018-05-28: qty 20

## 2018-05-28 MED ORDER — LIDOCAINE 2% (20 MG/ML) 5 ML SYRINGE
INTRAMUSCULAR | Status: AC
  Filled 2018-05-28: qty 5

## 2018-05-28 MED ORDER — FENTANYL CITRATE (PF) 100 MCG/2ML IJ SOLN: 25.0000 ug | INTRAMUSCULAR | Status: DC | PRN

## 2018-05-28 MED ORDER — HEMOSTATIC AGENTS (NO CHARGE) OPTIME
TOPICAL | Status: DC | PRN
  Administered 2018-05-28: 1 via TOPICAL

## 2018-05-28 MED ORDER — KETOROLAC TROMETHAMINE 15 MG/ML IJ SOLN
INTRAMUSCULAR | Status: AC
  Filled 2018-05-28: qty 1

## 2018-05-28 MED ORDER — ACETAMINOPHEN 10 MG/ML IV SOLN
INTRAVENOUS | Status: AC
  Filled 2018-05-28: qty 100

## 2018-05-28 MED ORDER — ACETAMINOPHEN 10 MG/ML IV SOLN
INTRAVENOUS | Status: DC | PRN
  Administered 2018-05-28: 1000 mg via INTRAVENOUS

## 2018-05-28 MED ORDER — PROPOFOL 10 MG/ML IV BOLUS
INTRAVENOUS | Status: DC | PRN
  Administered 2018-05-28: 160 mg via INTRAVENOUS

## 2018-05-28 MED ORDER — DEXAMETHASONE SODIUM PHOSPHATE 10 MG/ML IJ SOLN
INTRAMUSCULAR | Status: AC
  Filled 2018-05-28: qty 1

## 2018-05-28 MED ORDER — KETOROLAC TROMETHAMINE 30 MG/ML IJ SOLN
INTRAMUSCULAR | Status: AC
  Filled 2018-05-28: qty 1

## 2018-05-28 MED ORDER — FENTANYL CITRATE (PF) 100 MCG/2ML IJ SOLN
INTRAMUSCULAR | Status: DC | PRN
  Administered 2018-05-28 (×6): 50 ug via INTRAVENOUS
  Administered 2018-05-28 (×2): 100 ug via INTRAVENOUS

## 2018-05-28 MED ORDER — THROMBIN 5000 UNITS EX SOLR
CUTANEOUS | Status: AC
  Filled 2018-05-28: qty 5000

## 2018-05-28 MED ORDER — LOSARTAN POTASSIUM 50 MG PO TABS
50.0000 mg | ORAL_TABLET | Freq: Every day | ORAL | Status: DC
  Administered 2018-05-29: 50 mg via ORAL
  Filled 2018-05-28: qty 1

## 2018-05-28 MED ORDER — MIDAZOLAM HCL 2 MG/2ML IJ SOLN
INTRAMUSCULAR | Status: AC
  Filled 2018-05-28: qty 2

## 2018-05-28 MED ORDER — FAMOTIDINE 20 MG PO TABS
20.0000 mg | ORAL_TABLET | Freq: Two times a day (BID) | ORAL | Status: DC
  Administered 2018-05-28 – 2018-05-29 (×2): 20 mg via ORAL
  Filled 2018-05-28 (×2): qty 1

## 2018-05-28 MED ORDER — ACETAMINOPHEN 325 MG PO TABS: 650.0000 mg | ORAL_TABLET | ORAL | Status: DC | PRN

## 2018-05-28 MED ORDER — OXYCODONE HCL 5 MG PO TABS: 5.0000 mg | ORAL_TABLET | Freq: Once | ORAL | Status: DC | PRN

## 2018-05-28 MED ORDER — SODIUM CHLORIDE 0.9% FLUSH: 3.0000 mL | INTRAVENOUS | Status: DC | PRN

## 2018-05-28 MED ORDER — POTASSIUM CHLORIDE IN NACL 20-0.9 MEQ/L-% IV SOLN: INTRAVENOUS | Status: DC

## 2018-05-28 MED ORDER — 0.9 % SODIUM CHLORIDE (POUR BTL) OPTIME
TOPICAL | Status: DC | PRN
  Administered 2018-05-28: 1000 mL

## 2018-05-28 MED ORDER — SODIUM CHLORIDE 0.9 % IV SOLN
INTRAVENOUS | Status: DC | PRN
  Administered 2018-05-28: 20 ug/min via INTRAVENOUS

## 2018-05-28 MED ORDER — MENTHOL 3 MG MT LOZG: 1.0000 | LOZENGE | OROMUCOSAL | Status: DC | PRN

## 2018-05-28 MED ORDER — SODIUM CHLORIDE 0.9 % IV SOLN
INTRAVENOUS | Status: DC | PRN
  Administered 2018-05-28: 12:00:00

## 2018-05-28 MED ORDER — ONDANSETRON HCL 4 MG/2ML IJ SOLN: 4.0000 mg | Freq: Once | INTRAMUSCULAR | Status: DC | PRN

## 2018-05-28 MED ORDER — ALUM & MAG HYDROXIDE-SIMETH 200-200-20 MG/5ML PO SUSP: 30.0000 mL | Freq: Four times a day (QID) | ORAL | Status: DC | PRN

## 2018-05-28 SURGICAL SUPPLY — 69 items
ADH SKN CLS APL DERMABOND .7 (GAUZE/BANDAGES/DRESSINGS) ×2
APL SKNCLS STERI-STRIP NONHPOA (GAUZE/BANDAGES/DRESSINGS) ×1
BAG DECANTER FOR FLEXI CONT (MISCELLANEOUS) ×2 IMPLANT
BENZOIN TINCTURE PRP APPL 2/3 (GAUZE/BANDAGES/DRESSINGS) ×2 IMPLANT
BLADE CLIPPER SURG (BLADE) IMPLANT
BUR ACRON 5.0MM COATED (BURR) ×3 IMPLANT
BUR MATCHSTICK NEURO 3.0 LAGG (BURR) ×2 IMPLANT
CAGE POST PL TRIT 9X14X23 12D (Plate) ×2 IMPLANT
CANISTER SUCT 3000ML PPV (MISCELLANEOUS) ×2 IMPLANT
CAP LCK SPNE (Orthopedic Implant) ×6 IMPLANT
CAP LOCK SPINE RADIUS (Orthopedic Implant) IMPLANT
CAP LOCKING (Orthopedic Implant) ×12 IMPLANT
CARTRIDGE OIL MAESTRO DRILL (MISCELLANEOUS) ×1 IMPLANT
CONT SPEC 4OZ CLIKSEAL STRL BL (MISCELLANEOUS) ×2 IMPLANT
COVER BACK TABLE 60X90IN (DRAPES) ×2 IMPLANT
DECANTER SPIKE VIAL GLASS SM (MISCELLANEOUS) ×2 IMPLANT
DERMABOND ADVANCED (GAUZE/BANDAGES/DRESSINGS) ×2
DERMABOND ADVANCED .7 DNX12 (GAUZE/BANDAGES/DRESSINGS) ×1 IMPLANT
DIFFUSER DRILL AIR PNEUMATIC (MISCELLANEOUS) ×2 IMPLANT
DRAPE C-ARM 42X72 X-RAY (DRAPES) ×4 IMPLANT
DRAPE C-ARMOR (DRAPES) IMPLANT
DRAPE HALF SHEET 40X57 (DRAPES) IMPLANT
DRAPE LAPAROTOMY 100X72X124 (DRAPES) ×2 IMPLANT
DRAPE POUCH INSTRU U-SHP 10X18 (DRAPES) ×2 IMPLANT
ELECT REM PT RETURN 9FT ADLT (ELECTROSURGICAL) ×2
ELECTRODE REM PT RTRN 9FT ADLT (ELECTROSURGICAL) ×1 IMPLANT
GAUZE 4X4 16PLY RFD (DISPOSABLE) IMPLANT
GAUZE SPONGE 4X4 12PLY STRL (GAUZE/BANDAGES/DRESSINGS) ×2 IMPLANT
GLOVE BIOGEL PI IND STRL 8 (GLOVE) ×2 IMPLANT
GLOVE BIOGEL PI INDICATOR 8 (GLOVE) ×2
GLOVE ECLIPSE 7.5 STRL STRAW (GLOVE) ×4 IMPLANT
GOWN STRL REUS W/ TWL LRG LVL3 (GOWN DISPOSABLE) IMPLANT
GOWN STRL REUS W/ TWL XL LVL3 (GOWN DISPOSABLE) ×2 IMPLANT
GOWN STRL REUS W/TWL 2XL LVL3 (GOWN DISPOSABLE) IMPLANT
GOWN STRL REUS W/TWL LRG LVL3 (GOWN DISPOSABLE)
GOWN STRL REUS W/TWL XL LVL3 (GOWN DISPOSABLE) ×4
KIT BASIN OR (CUSTOM PROCEDURE TRAY) ×2 IMPLANT
KIT INFUSE X SMALL 1.4CC (Orthopedic Implant) ×1 IMPLANT
KIT TURNOVER KIT B (KITS) ×2 IMPLANT
NDL ASP BONE MRW 8GX15 (NEEDLE) IMPLANT
NDL SPNL 18GX3.5 QUINCKE PK (NEEDLE) ×1 IMPLANT
NDL SPNL 22GX3.5 QUINCKE BK (NEEDLE) ×1 IMPLANT
NEEDLE ASP BONE MRW 8GX15 (NEEDLE) ×2 IMPLANT
NEEDLE SPNL 18GX3.5 QUINCKE PK (NEEDLE) ×2 IMPLANT
NEEDLE SPNL 22GX3.5 QUINCKE BK (NEEDLE) ×2 IMPLANT
NS IRRIG 1000ML POUR BTL (IV SOLUTION) ×2 IMPLANT
OIL CARTRIDGE MAESTRO DRILL (MISCELLANEOUS) ×2
PACK LAMINECTOMY NEURO (CUSTOM PROCEDURE TRAY) ×2 IMPLANT
PAD ARMBOARD 7.5X6 YLW CONV (MISCELLANEOUS) ×6 IMPLANT
PATTIES SURGICAL .5 X.5 (GAUZE/BANDAGES/DRESSINGS) IMPLANT
PATTIES SURGICAL .5 X1 (DISPOSABLE) IMPLANT
PATTIES SURGICAL 1X1 (DISPOSABLE) IMPLANT
ROD 5.5X60MM PURPLE (Rod) ×1 IMPLANT
ROD MAX 50MM (Rod) ×1 IMPLANT
SCREW 5.75X45MM (Screw) ×2 IMPLANT
SPONGE LAP 4X18 RFD (DISPOSABLE) IMPLANT
SPONGE NEURO XRAY DETECT 1X3 (DISPOSABLE) IMPLANT
SPONGE SURGIFOAM ABS GEL 100 (HEMOSTASIS) ×2 IMPLANT
STRIP BIOACTIVE VITOSS 25X100X (Neuro Prosthesis/Implant) ×2 IMPLANT
SUT VIC AB 1 CT1 18XBRD ANBCTR (SUTURE) ×2 IMPLANT
SUT VIC AB 1 CT1 8-18 (SUTURE) ×6
SUT VIC AB 2-0 CP2 18 (SUTURE) ×5 IMPLANT
SYR 3ML LL SCALE MARK (SYRINGE) IMPLANT
SYR CONTROL 10ML LL (SYRINGE) ×2 IMPLANT
TAPE CLOTH SURG 4X10 WHT LF (GAUZE/BANDAGES/DRESSINGS) ×1 IMPLANT
TOWEL GREEN STERILE (TOWEL DISPOSABLE) ×2 IMPLANT
TOWEL GREEN STERILE FF (TOWEL DISPOSABLE) ×2 IMPLANT
TRAY FOLEY MTR SLVR 16FR STAT (SET/KITS/TRAYS/PACK) ×2 IMPLANT
WATER STERILE IRR 1000ML POUR (IV SOLUTION) ×2 IMPLANT

## 2018-05-28 NOTE — Progress Notes (Signed)
Vitals:   05/28/18 1730 05/28/18 1745 05/28/18 1800 05/28/18 1825  BP: (!) 130/57 (!) 140/55 (!) 147/61 (!) 159/73  Pulse: 88 96  (!) 104  Resp: 16 14  19   Temp:    97.9 F (36.6 C)  TempSrc:    Oral  SpO2: 96% 96% 95% 96%    Patient resting comfortably in bed.  Arrived just a short while ago from PACU.  Foley to straight drainage.  Dressing clean and dry.  Moving all 4 extremities well.  Plan: With patient and nursing staff about the importance of ambulation this evening.  Will DC Foley in morning.  Continue to progress through postoperative recovery.  Hewitt Shorts, MD 05/28/2018, 7:22 PM

## 2018-05-28 NOTE — Op Note (Signed)
05/28/2018  4:10 PM  PATIENT:  [name]  77 y.o. 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; lumbar spondylosis; lumbar degenerative disease; right lumbar radiculopathy  PROCEDURE:  Procedure(s): Bilateral L4-5 lumbar decompression including laminectomy, facetectomy, and foraminotomies for the stenotic compression of the exiting L4 and L5 nerve roots bilaterally, with decompression beyond that required for interbody arthrodesis; bilateral L4-5 posterior lumbar interbody arthrodesis with Tritanium interbody implants, Vitoss BA with bone marrow aspirate, and infuse; bilateral L4-5 posterior lateral arthrodesis with segmental radius posterior instrumentation (L4 to L5 to S1), Vitoss BA with bone marrow aspirate, and infuse  SURGEON: Shirlean Kelly, MD  ASSISTANTS: Autumn Patty, MD  ANESTHESIA:   general  EBL:  Total I/O In: 1000 [I.V.:1000] Out: 740 [Urine:340; Blood:400]  BLOOD ADMINISTERED:none  CELL SAVER GIVEN: Cell Saver technician felt that there was insufficient blood loss to return blood to the patient  COUNT:  Correct per nursing staff  DICTATION: Patient was brought to the operating room placed under general endotracheal anesthesia. The patient was turned to prone position the lumbar region was prepped with Betadine soap and solution, the previous midline incision was marked, and the field was draped in a sterile fashion. The midline was infiltrated with local anesthesia with epinephrine. A localizing x-ray was taken and then a midline incision was made through the previous midline incision, and extended rostrally.  It was carried down through the subcutaneous tissue, bipolar cautery and electrocautery were used  to maintain hemostasis. Dissection was carried down to the lumbar fascia. The fascia was incised bilaterally and the paraspinal muscles were dissected with a spinous process and lamina in a subperiosteal fashion. Another x-ray was taken for localization and the L3 and L4 spinous processes and lamina were identified.    We then dissected caudally and identified the existing L5 and S1 radius posterior instrumentation.  Bony overgrowth over the instrumentation was carefully removed.  We then locked the locking caps, which were then removed.  The rods were then removed.  We then cleared away bone and soft tissue around the screw heads so that they were fully mobile.  The dissection was then carried out laterally over the L4-5 facet complex and the transverse processes of L4 and L5 were exposed and decorticated.   Bilateral L4 laminectomy was performed using the high-speed drill and Kerrison punches. Dissection was carried out laterally including facetectomy and foraminotomies with decompression of the stenotic compression of the exiting L4 and L5 nerve roots. Once the decompression of the stenotic compression of the thecal sac and exiting nerve roots was completed we proceeded with the posterior lumbar interbody arthrodesis. The annulus was incised bilaterally and the disc space entered. A thorough discectomy was performed using pituitary rongeurs and curettes. Once the discectomy was completed we began to prepare the endplate surfaces removing the cartilaginous endplates surface. We then measured the height of the intervertebral disc space. We selected 14 x 23 x 12 x 9 Tritanium interbody implants.  The existing L5 and S1 screws were left in place, and later used as part of the posterior instrumentation construct.  The C-arm fluoroscope was then draped and brought in the field and we identified the pedicle entry points bilaterally at the L4 level. Each of the 2 pedicles was probed, we aspirated bone marrow  aspirate from the vertebral bodies, this  was injected over two 10 cc strips of Vitoss BA. Then each of the pedicles was examined with the ball probe good bony surfaces were found and no bony cuts were found. Each of the pedicles was then tapped with a 5.25 mm tap, again examined with the ball probe good threading was found and no bony cuts were found. We then placed 5.75 by 45 millimeter screws bilaterally at the L4 level.  We then packed the Tritanium interbody implants with Vitoss BA with bone marrow aspirate and infuse.  The lateral aspect of the disc space was then packed with Vitoss BA with bone marrow aspirate bilaterally.  We first placed an implant on the right side, carefully retracting the thecal sac and nerve root medially. We then went back to the left side and packed the midline with additional Vitoss BA with bone marrow aspirate infuse, and then placed the second implant on the left side again retracting the thecal sac and nerve root medially.   We then packed the lateral gutter over the transverse processes of L4 and L5 and intertransverse space with Vitoss BA with bone marrow aspirate and infuse. We then selected pre-lordosed rods.  We used a 16 mm rod on the left and a 50 mm rod on the right.  They were placed within the screw heads at L4, L5, and S1, and secured with locking caps.  Once all 6 locking caps were placed final tightening was performed against a counter torque.  The wound had been irrigated multiple times during the procedure with saline solution and bacitracin solution, good hemostasis was established with a combination of bipolar cautery and Gelfoam with thrombin.  The Gelfoam was removed, and a thin layer of Surgifoam applied.  Once good hemostasis was confirmed we proceeded with closure paraspinal muscles deep fascia and Scarpa's fascia were closed with interrupted undyed 1 Vicryl sutures the subcutaneous and subcuticular closed with interrupted inverted 2-0 undyed Vicryl  sutures the skin edges were approximated with Dermabond.  A dressing of sterile gauze and Hypafix was applied.  Following surgery the patient was turned back to the supine position to be reversed and the anesthetic extubated and transferred to the recovery room for further care.  PLAN OF CARE: Admit to inpatient   PATIENT DISPOSITION:  PACU - hemodynamically stable.   Delay start of Pharmacological VTE agent (>24hrs) due to surgical blood loss or risk of bleeding:  yes

## 2018-05-28 NOTE — Transfer of Care (Signed)
Immediate Anesthesia Transfer of Care Note  Patient: [name]  Procedure(s) Performed: LUMBAR FOUR-FIVE DECOMPRESSION, POSTERIOR LUMBAR INTERBODY FUSION AND POSTERIOR LATERAL ARTHRODESIS (Bilateral Back)  Patient Location: PACU  Anesthesia Type:General  Level of Consciousness: drowsy  Airway & Oxygen Therapy: Patient Spontanous Breathing and Patient connected to nasal cannula oxygen  Post-op Assessment: Report given to RN, Post -op Vital signs reviewed and stable and Patient moving all extremities  Post vital signs: Reviewed and stable  Last Vitals:  Vitals Value Taken Time  BP 134/58 05/28/2018  4:23 PM  Temp    Pulse 84 05/28/2018  4:25 PM  Resp 16 05/28/2018  4:25 PM  SpO2 98 % 05/28/2018  4:25 PM  Vitals shown include unvalidated device data.  Last Pain:  Vitals:   05/28/18 0936  PainSc: 6       Patients Stated Pain Goal: 3 (05/28/18 0936)  Complications: No apparent anesthesia complications

## 2018-05-28 NOTE — Anesthesia Preprocedure Evaluation (Signed)
Anesthesia Evaluation  Patient identified by MRN, date of birth, ID band Patient awake    Reviewed: Allergy & Precautions, NPO status , Patient's Chart, lab work & pertinent test results  History of Anesthesia Complications Negative for: history of anesthetic complications  Airway Mallampati: II       Dental no notable dental hx.    Pulmonary neg pulmonary ROS,    Pulmonary exam normal        Cardiovascular hypertension, Normal cardiovascular exam     Neuro/Psych CVA, No Residual Symptoms negative psych ROS   GI/Hepatic Neg liver ROS, GERD  Controlled,  Endo/Other  diabetes, Oral Hypoglycemic Agents  Renal/GU negative Renal ROS  negative genitourinary   Musculoskeletal  (+) Arthritis ,   Abdominal   Peds  Hematology negative hematology ROS (+)   Anesthesia Other Findings   Reproductive/Obstetrics                             Anesthesia Physical Anesthesia Plan  ASA: III  Anesthesia Plan: General   Post-op Pain Management:    Induction: Intravenous  PONV Risk Score and Plan: 2 and Ondansetron, Dexamethasone and Treatment may vary due to age or medical condition  Airway Management Planned: Oral ETT  Additional Equipment:   Intra-op Plan:   Post-operative Plan: Extubation in OR  Informed Consent:   Plan Discussed with:   Anesthesia Plan Comments:         Anesthesia Quick Evaluation

## 2018-05-28 NOTE — Anesthesia Procedure Notes (Signed)
Procedure Name: Intubation Date/Time: 05/28/2018 11:16 AM Performed by: Leonor Liv, CRNA Pre-anesthesia Checklist: Patient identified, Emergency Drugs available, Suction available and Patient being monitored Patient Re-evaluated:Patient Re-evaluated prior to induction Oxygen Delivery Method: Circle System Utilized Preoxygenation: Pre-oxygenation with 100% oxygen Induction Type: IV induction Ventilation: Mask ventilation without difficulty Laryngoscope Size: Mac and 4 Grade View: Grade II Tube type: Oral Tube size: 7.5 mm Number of attempts: 1 Airway Equipment and Method: Stylet and Oral airway Placement Confirmation: ETT inserted through vocal cords under direct vision,  positive ETCO2 and breath sounds checked- equal and bilateral Secured at: 23 cm Tube secured with: Tape Dental Injury: Teeth and Oropharynx as per pre-operative assessment

## 2018-05-28 NOTE — H&P (Signed)
Subjective: Patient is a 77 y.o. right handed white male who is admitted for treatment of lumbar stenosis with resulting disabling low back and right lumbar radicular pain.  Patient is 6 years status post a L5 Gill procedure, bilateral L5-S1 PLIF and PLA.  He did well with that surgery.  He developed severe pain about a month and a half ago with pain across the low back, worse to the right side, extending through the right lower extremity, into the right buttock, anterior right thigh and leg towards the right foot.  He is been worked up with x-rays and MRI scan which reveal right L4-5 lateral recess and neuroforaminal stenosis.  He does have left L3-4 lateral recess and neuroforaminal stenosis, but no left-sided symptoms.  He is admitted now for L4-5 lumbar decompression including laminectomy, facetectomy, and foraminotomies and arthrodesis including L4-5 posterior lumbar interbody arthrodesis with interbody implants and bone graft and bilateral L4-5 posterior lateral arthrodesis with posterior instrumentation and bone graft.   Patient Active Problem List   Diagnosis Date Noted  . CVA (cerebral vascular accident) (HCC) 12/27/2016  . Vertigo 12/27/2016  . Dizziness 12/26/2016  . GERD (gastroesophageal reflux disease) 12/26/2016  . Unsteady gait   . Benign essential HTN 10/11/2015  . Leukocytosis 10/11/2015  . ARF (acute renal failure) (HCC) 10/11/2015  . Hypokalemia 10/11/2015  . Controlled type 2 diabetes mellitus without complication, without long-term current use of insulin (HCC) 10/11/2015  . Sepsis secondary to UTI (HCC) 10/11/2015   Past Medical History:  Diagnosis Date  . Arthritis   . Diabetes mellitus without complication (HCC)   . GERD (gastroesophageal reflux disease) 12/26/2016  . History of kidney stones    Lithotripsy  . Hypertension   . Stroke [facility])    Light Stroke during episode of vertigo - April 2018    Past Surgical History:  Procedure Laterality Date  . BACK SURGERY     . COLONOSCOPY WITH PROPOFOL N/A 11/18/2014   Procedure: COLONOSCOPY WITH PROPOFOL;  Surgeon: Charolett Bumpers, MD;  Location: [facility];  Service: Endoscopy;  Laterality: N/A;  . LITHOTRIPSY    . ROTATOR CUFF REPAIR     bilateral shoulder  . THROAT SURGERY     benign tumor removed    No medications prior to admission.   No Known Allergies  Social History   Tobacco Use  . Smoking status: Never Smoker  . Smokeless tobacco: Never Used  Substance Use Topics  . Alcohol use: No    Family History  Problem Relation Age of Onset  . Heart failure Mother   . Dementia Mother      Review of Systems Pertinent items noted in HPI and remainder of comprehensive ROS otherwise negative.  EXAM: Patient is a well-developed well-nourished white male in no acute distress.   Lungs are clear to auscultation , the patient has symmetrical respiratory excursion. Heart has a regular rate and rhythm normal S1 and S2 no murmur.   Abdomen is soft nontender nondistended bowel sounds are present. Extremity examination shows no clubbing cyanosis or edema. Motor examination shows 5 over 5 strength in the lower extremities including the iliopsoas quadriceps dorsiflexor extensor hallicus  longus and plantar flexor bilaterally. Sensation is intact to pinprick in the distal lower extremities. Reflexes are symmetrical bilaterally. No pathologic reflexes are present. Patient has a normal gait and stance.  Data Review:CBC    Component Value Date/Time   WBC 7.6 05/22/2018 1105   RBC 5.20 05/22/2018 1105   HGB 15.9  05/22/2018 1105   HCT 46.8 05/22/2018 1105   PLT 219 05/22/2018 1105   MCV 90.0 05/22/2018 1105   MCH 30.6 05/22/2018 1105   MCHC 34.0 05/22/2018 1105   RDW 13.0 05/22/2018 1105   LYMPHSABS 1.4 12/26/2016 1555   MONOABS 0.9 12/26/2016 1555   EOSABS 0.3 12/26/2016 1555   BASOSABS 0.0 12/26/2016 1555                          BMET    Component Value Date/Time   NA 140 05/22/2018 1105   K 3.7  05/22/2018 1105   CL 101 05/22/2018 1105   CO2 30 05/22/2018 1105   GLUCOSE 203 (H) 05/22/2018 1105   BUN 13 05/22/2018 1105   CREATININE 1.07 05/22/2018 1105   CALCIUM 9.8 05/22/2018 1105   GFRNONAA >60 05/22/2018 1105   GFRAA >60 05/22/2018 1105     Assessment/Plan: Patient with low back and right lumbar radicular pain with right L4-5 lateral recess stenosis, and a previous L5-S1 decompression and arthrodesis.  Patient is admitted now for a L4-5 lumbar decompression and arthrodesis.  I've discussed with the patient the nature of his condition, the nature the surgical procedure, the typical length of surgery, hospital stay, and overall recuperation, the limitations postoperatively, and risks of surgery. I discussed risks including risks of infection, bleeding, possibly need for transfusion, the risk of nerve root dysfunction with pain, weakness, numbness, or paresthesias, the risk of dural tear and CSF leakage and possible need for further surgery, the risk of failure of the arthrodesis and possibly for further surgery, the risk of anesthetic complications including myocardial infarction, stroke, pneumonia, and death. We discussed the need for postoperative immobilization in a lumbar brace. Understanding all this the patient does wish to proceed with surgery and is admitted for such.   Hewitt Shorts, MD 05/28/2018 7:21 AM

## 2018-05-28 NOTE — Anesthesia Postprocedure Evaluation (Signed)
Anesthesia Post Note  Patient: Cruse Heinzerling  Procedure(s) Performed: LUMBAR FOUR-FIVE DECOMPRESSION, POSTERIOR LUMBAR INTERBODY FUSION AND POSTERIOR LATERAL ARTHRODESIS (Bilateral Back)     Patient location during evaluation: PACU Anesthesia Type: General Level of consciousness: awake and alert Pain management: pain level controlled Vital Signs Assessment: post-procedure vital signs reviewed and stable Respiratory status: spontaneous breathing, nonlabored ventilation and respiratory function stable Cardiovascular status: blood pressure returned to baseline and stable Postop Assessment: no apparent nausea or vomiting Anesthetic complications: no    Last Vitals:  Vitals:   05/28/18 0906  BP: (!) 167/71  Pulse: 80  Resp: 20  Temp: 36.6 C  SpO2: 99%    Last Pain:  Vitals:   05/28/18 0936  PainSc: 6                  Lowella Curb

## 2018-05-29 LAB — GLUCOSE, CAPILLARY
Glucose-Capillary: 236 mg/dL — ABNORMAL HIGH (ref 70–99)
Glucose-Capillary: 235 mg/dL — ABNORMAL HIGH (ref 70–99)

## 2018-05-29 MED ORDER — HYDROCODONE-ACETAMINOPHEN 5-325 MG PO TABS: 1.0000 | ORAL_TABLET | ORAL | 0 refills | Status: AC | PRN

## 2018-05-29 MED FILL — Thrombin For Soln 5000 Unit: CUTANEOUS | Qty: 5000 | Status: AC

## 2018-05-29 MED FILL — Thrombin For Soln Kit 20000 Unit: CUTANEOUS | Qty: 1 | Status: AC

## 2018-05-29 NOTE — Discharge Summary (Signed)
Physician Discharge Summary  Patient ID: [name] MRN: [id number] DOB/AGE: 06-18-1941 77 y.o.  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  He is done well following surgery.  He is up and ambulating actively in the halls.  His Foley was DC'd earlier this morning, and if his voiding function is good we will allow him to be discharged home later today.  Dressing was removed, and his incision is healing nicely.  There is no ecchymosis, erythema, swelling, or drainage.  He has been given instructions for wound care and activities following discharge.  He is scheduled for follow-up with me in the office in 3 weeks.  Discharge Exam: Blood pressure 127/60, pulse 86, temperature 98.5 F (36.9 C), temperature source Oral, resp. rate 18, SpO2 96 %.  Disposition: Home  Allergies as of 05/29/2018   No Known Allergies     Medication List    STOP taking these medications   cyclobenzaprine 10 MG tablet Commonly known as:  FLEXERIL   lidocaine 5 % Commonly known as:  LIDODERM   meloxicam 7.5 MG tablet Commonly known as:  MOBIC     TAKE these medications   ALPRAZolam 0.5 MG tablet Commonly known as:  XANAX Take 0.25 mg by mouth at bedtime.   amLODipine 10 MG tablet Commonly known as:  NORVASC Take 10 mg by mouth daily.   clopidogrel 75 MG tablet Commonly known as:  PLAVIX Take 1 tablet (75 mg total) by mouth  daily.   glimepiride 1 MG tablet Commonly known as:  AMARYL Take 1 mg by mouth daily.   HYDROcodone-acetaminophen 5-325 MG tablet Commonly known as:  NORCO/VICODIN Take 1-2 tablets by mouth every 4 (four) hours as needed (pain).   losartan-hydrochlorothiazide 50-12.5 MG tablet Commonly known as:  HYZAAR Take 1 tablet by mouth daily.   meclizine 25 MG tablet Commonly known as:  ANTIVERT Take 1 tablet (25 mg total) by mouth 3 (three) times daily as needed for dizziness.   metFORMIN 500 MG tablet Commonly known as:  GLUCOPHAGE Take 500 mg by mouth 2 (two) times daily with a meal.   multivitamin with minerals Tabs tablet Take 1 tablet by mouth daily.   pioglitazone 15 MG tablet Commonly known as:  ACTOS Take 15 mg by mouth daily.   ranitidine 150 MG tablet Commonly known as:  ZANTAC Take 150 mg by mouth 2 (two) times daily.        SignedHewitt Shorts 05/29/2018, 8:40 AM

## 2018-05-29 NOTE — Progress Notes (Signed)
Discharged instructions/education/AVS/Rx given to patient with family at bedside and they all verbalized understanding. Patient ambulating well with supervision. Pain is mild to moderate and controlled by PRN medication. Voiding and emptying bladder well, post void residual done twice. No drainage, no redness, no swelling on incision site noted. Discharged via wheelchair.

## 2018-05-31 MED FILL — Heparin Sodium (Porcine) Inj 1000 Unit/ML: INTRAMUSCULAR | Qty: 30 | Status: AC

## 2018-05-31 MED FILL — Sodium Chloride IV Soln 0.9%: INTRAVENOUS | Qty: 1000 | Status: AC

## 2018-11-29 ENCOUNTER — Ambulatory Visit: Payer: Medicare Other | Admitting: Neurology

## 2018-12-31 ENCOUNTER — Telehealth: Payer: Self-pay

## 2018-12-31 NOTE — Telephone Encounter (Signed)
I called pt that we are only doing video visit due to COVID 19.We are not doing face to face in office visits. Pt put his wife on the phone. I explain reason for video visit. The wife stated she does not have a smart phone or computer with camera. She stated a telephone visit can be done. Consent for telephone and to file insurance. She advise to call home phone for visit. I advise pts wife to be home at appt time of 300pm. She verbalized understanding.

## 2019-01-07 ENCOUNTER — Other Ambulatory Visit: Payer: Self-pay | Admitting: Neurology

## 2019-01-07 DIAGNOSIS — R42 Dizziness and giddiness: Secondary | ICD-10-CM

## 2019-01-08 ENCOUNTER — Other Ambulatory Visit: Payer: Self-pay | Admitting: Neurology

## 2019-01-08 ENCOUNTER — Other Ambulatory Visit: Payer: Self-pay

## 2019-01-08 ENCOUNTER — Ambulatory Visit (INDEPENDENT_AMBULATORY_CARE_PROVIDER_SITE_OTHER): Payer: Medicare Other | Admitting: Neurology

## 2019-01-08 DIAGNOSIS — R42 Dizziness and giddiness: Secondary | ICD-10-CM | POA: Diagnosis not present

## 2019-01-08 DIAGNOSIS — I671 Cerebral aneurysm, nonruptured: Secondary | ICD-10-CM | POA: Diagnosis not present

## 2019-01-08 MED ORDER — CLOPIDOGREL BISULFATE 75 MG PO TABS: 75.0000 mg | ORAL_TABLET | Freq: Every day | ORAL | 3 refills | Status: DC

## 2019-01-08 NOTE — Progress Notes (Signed)
Virtual Visit via Telephone Note  I connected with [name] on 01/08/19 at  3:00 PM EDT by telephone and verified that I am speaking with the correct person using two identifiers.  The patient was in his home and his wife was present as well.  I did this visit in my office.   I discussed the limitations, risks, security and privacy concerns of performing an evaluation and management service by telephone and the availability of in person appointments. I also discussed with the patient that there may be a patient responsible charge related to this service. The patient expressed understanding and agreed to proceed.   History of Present Illness: This is a virtual telephonic follow-up visit with [name] following his last office visit on 11/27/2017.  Patient states he is doing well.  He has had no recurrent episodes of vertigo or dizziness or any other focal neurological symptoms.  He remains on Plavix which is tolerating well without bruising or bleeding.  He states he is not having any significant headaches as well.  He in fact is quite active and is working in the yard in his garden.  He underwent back surgery in September 2019 by Dr. Newell Coral which has helped him a lot.  He underwent follow-up CT angiogram on 12/14/2017 which showed stable appearance of the small left posterior communicating artery aneurysm unchanged from previous study.  Patient has not had any other medication changes or any new health problems in the interim.    Observations/Objective: Physical and neurological exam difficult due to constraints of virtual telephonic visit.  Patient appeared awake alert interactive with normal speech and language.  Assessment and Plan:77 year Caucasian male with sudden onset of transient isolated vertigo without accompanying signs of unclear etiology. 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  History of  posterior head pain and tension headaches which appear to have improved as well.  Follow Up Instructions: I had a long discussion with the patient and his wife regarding his previous vertigo, small P-comm asymptomatic aneurysm and prior posterior headache and neck pains all of which appear stable.  Continue Plavix for stroke prevention and patient was given a refill with a 90-day supply upon request.  I recommend  maintenance control of hypertension with blood pressure goal below 130/90, lipids with LDL goal below 70 mg percent and diabetes with hemoglobin A1c goal below 6.5%.  He was also advised to eat a healthy diet with lots of fruits, vegetables, cereals and whole grains and to be active and exercise 30 minutes every day.  Continue conservative follow-up for his asymptomatic small brain aneurysm with follow-up imaging study every 2 -3 years.  He will return for follow-up in a year or call earlier if necessary I discussed the assessment and treatment plan with the patient. The patient was provided an opportunity to ask questions and all were answered. The patient agreed with the plan and demonstrated an understanding of the instructions.   The patient was advised to call back or seek an in-person evaluation if the symptoms worsen or if the condition fails to improve as anticipated.  I provided 15 minutes of non-face-to-face time during this encounter.   Delia Heady, MD

## 2020-01-01 ENCOUNTER — Other Ambulatory Visit: Payer: Self-pay | Admitting: Neurology

## 2020-01-01 DIAGNOSIS — R42 Dizziness and giddiness: Secondary | ICD-10-CM

## 2020-01-08 ENCOUNTER — Ambulatory Visit: Payer: Medicare Other | Admitting: Neurology

## 2020-01-08 ENCOUNTER — Other Ambulatory Visit: Payer: Self-pay

## 2020-01-08 ENCOUNTER — Telehealth: Payer: Self-pay | Admitting: Neurology

## 2020-01-08 ENCOUNTER — Encounter: Payer: Self-pay | Admitting: Neurology

## 2020-01-08 VITALS — BP 141/67 | Temp 97.2°F | Wt 184.0 lb

## 2020-01-08 DIAGNOSIS — I671 Cerebral aneurysm, nonruptured: Secondary | ICD-10-CM | POA: Diagnosis not present

## 2020-01-08 NOTE — Progress Notes (Signed)
[facility] [address] [place]. [place] [address] [phone]       OFFICE FOLLOW UP VISIT NOTE  Gabriel. [name] Date of Birth:  1940-10-18 Medical Record Number:  [id number]   Referring MD:  Carolin Sicks  Reason for Referral:  Vertigo  QVZ:[id number] Consult 01/24/17 ;  [name] is a 59 year pleasant caucasian male seen for consultation today. He is accompanied by his wife. History is obtained from the patient, wife and review of electronic medical records. I have personally reviewed imaging films. He presented with sudden onset of vertigo while he was working out in the yard on 12/27/16. He felt all of a sudden objects started moving counterclockwise towards the left. When he started to walk he was leaning to the left but was able to hold on and walking make it to a chair and sat down. This vertigo persisted despite any specific head movements even with eyes closed. He denied any nausea, vertigo. He was evaluated in the emergency room by: neurohospitalist Dr Paschal Dopp.And thought to have benign paroxysmal positional vertigo. Dix-Hallpike maneuver demonstrated rotatory nystagmus to the right greater than left. Patient's symptoms lasted an hour and half and resolved. Patient had no prior history of tinnitus, vertigo or inner ear problems. He states his symptoms completely resolved and have not recurred since then. He had MRI scan of the brain which showed no definite acute infarct or vascular lesion. A small subcentimeter lesion was noted in the right mid temporal cortex showing faint enhancement and some gliosis and was felt to be of indeterminate etiology possibly a subacute infarct. 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 Patient has no known prior history of inner ear problems, tinnitus, hearing loss, ear infections or vertigo. He has no family history of  aneurysms and does not smoke and states blood pressure control has not been a problem. Update 05/16/2017 : He returns for follow-up after last visit 3-1/2 months ago. He states is doing a lot better and is no longer feeling dizzy. He does complain of mild posterior headaches for the last 2 months. He feels that is hurting mostly towards the back when his mind on a recliner. He describes a sense of heaviness and tightness and posterior neck and back of his head. He rarely needs to take medications as this is not disabling. He denies any nausea light or sound sensitivity. He remains on Plavix which is tolerating well without bleeding or bruising. His blood pressure is well controlled and today it is 138/71. Fasting malignancy was 6.8. Patient had MRI scan of the brain done with and without contrast with special attention to internal auditory canals on 02/07/17 at [facility] which are personally reviewed shows no structural lesion or tumor. There are mild changes of chronic microvascular ischemia but no acute abnormality. Update 11/27/2017 : He returns for follow-up after last visit 6 months ago. He is accompanied by his wife. He states is doing well and has not had any dizzy spells. Continues to have mild posterior headaches and neck pain particularly when his bleeding is that back in a certain position on the recliner. He feels that below it feels better. He denies any right leg pain. Is not had any recurrent vertigo episode either. He has been doing regular neck stretching exercises. Is tolerating Plavix well without bleeding or bruising. He states his blood pressure  is under good control and today it is 138/84. His fasting sugars have also been quite good. He is concerned about his brain aneurysm and wants follow-up CT angiogram to be done. Update 01/08/2020 : He returns for follow-up after last virtual video visit 1 year ago.  He states he is doing well and has had no recurrent episodes of  dizziness or vertigo.  His posterior occipital headaches and tension headaches also seem to have resolved.  He is overall had a good year without any recurrent stroke or TIA symptoms.  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  He and his wife both have gotten the Covid shots and had no issues with it.  He has no new complaints today. ROS:   14 system review of systems is positive for back pain headache dizziness only and all other systems negative  PMH:  Past Medical History:  Diagnosis Date  . Arthritis   . Diabetes mellitus without complication (HCC)   . GERD (gastroesophageal reflux disease) 12/26/2016  . History of kidney stones    Lithotripsy  . Hypertension   . 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 Marital status: Married    Spouse name: Not on file  . Number of children: Not on file  . Years of education: Not on file  . Highest education level: Not on file  Occupational History  . Not on file  Tobacco Use  . Smoking status: Never Smoker  . Smokeless tobacco: Never Used  Substance and Sexual Activity  . Alcohol use: No  . Drug use: No  . Sexual activity: Not on file  Other Topics Concern  . Not on file  Social History Narrative  . Not on file   Social Determinants of Health   Financial Resource Strain:   . Difficulty of Paying Living Expenses:   Food Insecurity:   . Worried About Programme researcher, broadcasting/film/video in the Last Year:   . Barista in the Last Year:   Transportation Needs:   . Freight forwarder (Medical):   Marland Kitchen Lack of Transportation (Non-Medical):   Physical Activity:   . Days of Exercise per Week:   . Minutes of Exercise per Session:   Stress:   . Feeling of Stress :   Social Connections:   . Frequency of Communication with Friends and Family:   .  Frequency of Social Gatherings with Friends and Family:   . Attends Religious Services:   . Active Member of Clubs or Organizations:   . Attends Banker Meetings:   Marland Kitchen Marital Status:   Intimate Partner Violence:   . Fear of Current or Ex-Partner:   . Emotionally Abused:   Marland Kitchen Physically Abused:   . Sexually Abused:     Medications:   Current Outpatient Medications on File Prior to Visit  Medication Sig Dispense Refill  . ALPRAZolam (XANAX) 0.5 MG tablet Take 0.25 mg by mouth at bedtime.    Marland Kitchen amLODipine (NORVASC) 10 MG tablet Take 10 mg by mouth daily.     . clopidogrel (PLAVIX) 75 MG tablet TAKE 1 TABLET BY MOUTH EVERY DAY 90 tablet 0  . glimepiride (AMARYL) 1 MG tablet Take 1 mg by mouth daily.     Marland Kitchen  HYDROcodone-acetaminophen (NORCO/VICODIN) 5-325 MG tablet Take 1-2 tablets by mouth every 4 (four) hours as needed (pain). 40 tablet 0  . losartan-hydrochlorothiazide (HYZAAR) 50-12.5 MG tablet Take 1 tablet by mouth daily.    . meclizine (ANTIVERT) 25 MG tablet Take 1 tablet (25 mg total) by mouth 3 (three) times daily as needed for dizziness. 30 tablet 0  . metFORMIN (GLUCOPHAGE) 500 MG tablet Take 500 mg by mouth 2 (two) times daily with a meal.    . Multiple Vitamin (MULTIVITAMIN WITH MINERALS) TABS Take 1 tablet by mouth daily.    . pioglitazone (ACTOS) 15 MG tablet Take 15 mg by mouth daily.    . ranitidine (ZANTAC) 150 MG tablet Take 150 mg by mouth 2 (two) times daily.     No current facility-administered medications on file prior to visit.    Allergies:  No Known Allergies  Physical Exam General: Pleasant elderly Caucasian male, seated, in no evident distress Head: head normocephalic and atraumatic.   Neck: supple with no carotid or supraclavicular bruits Cardiovascular: regular rate and rhythm, no murmurs Musculoskeletal: no deformity. Mild spasm of posterior neck muscles with restriction of movements in all directions. Skin:  no rash/petichiae Vascular:   Normal pulses all extremities  Neurologic Exam Mental Status: Awake and fully alert. Oriented to place and time. Recent and remote memory intact. Attention span, concentration and fund of knowledge appropriate. Mood and affect appropriate.  Cranial Nerves: Fundoscopic exam  not done Pupils equal, briskly reactive to light. Extraocular movements full without nystagmus. Visual fields full to confrontation. Hearing intact. Facial sensation intact. Face, tongue, palate moves normally and symmetrically.  Motor: Normal bulk and tone. Normal strength in all tested extremity muscles. Sensory.: intact to touch , pinprick , position and vibratory sensation.  Coordination: Rapid alternating movements normal in all extremities. Finger-to-nose and heel-to-shin performed accurately bilaterally. Gait and Station: Arises from chair without difficulty. Stance is normal. Gait demonstrates normal stride length and balance . Able to heel, toe and tandem walk with mild difficulty.  Reflexes: 1+ and symmetric. Toes downgoing.       ASSESSMENT: 30 year Caucasian male with sudden onset of transient isolated vertigo without accompanying signs of unclear etiology. 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 Mild posterior head and neck pain likely tension headaches which appear to have resolved    PLAN: I had a long discussion with the patient and his wife regarding his previous vertigo, small P-comm asymptomatic aneurysm and prior posterior headache and neck pains all of which appear stable.  Continue Plavix for stroke prevention and maintain strict control of hypertension with blood pressure goal below 130/90, lipids with LDL goal below 70 mg percent and diabetes with hemoglobin A1c goal below 6.5%.  He was also advised to eat a healthy diet with lots of fruits, vegetables, cereals and whole grains and to be active and exercise 30 minutes every day.   Continue conservative follow-up for his asymptomatic small brain aneurysm and will repeat follow-up MRA brain as his last imaging study was 2 -3 years ago.Marland Kitchen  He will return for follow-up in a year or call earlier if necessary.Greater than 50% time during this 25 minute visit was spent on counseling and coordination of care about his  dizziness and asymptomatic cerebral aneurysm and answering questions Delia Heady, MD  [facility] [address] [place], [place] [address]  Phone [phone] Fax [phone] Note: This document was prepared with digital dictation  and possible smart Company secretary. Any transcriptional errors that result from this process are unintentional.

## 2020-01-08 NOTE — Patient Instructions (Signed)
I had a long discussion with the patient and his wife regarding his previous vertigo, small P-comm asymptomatic aneurysm and prior posterior headache and neck pains all of which appear stable.  Continue Plavix for stroke prevention and maintain strict control of hypertension with blood pressure goal below 130/90, lipids with LDL goal below 70 mg percent and diabetes with hemoglobin A1c goal below 6.5%.  He was also advised to eat a healthy diet with lots of fruits, vegetables, cereals and whole grains and to be active and exercise 30 minutes every day.  Continue conservative follow-up for his asymptomatic small brain aneurysm and will repeat follow-up MRA brain as his last imaging study was 2 -3 years ago.Marland Kitchen  He will return for follow-up in a year or call earlier if necessary

## 2020-01-08 NOTE — Telephone Encounter (Signed)
[employer] medicare order sent to GI. No auth they will reach out to the patient to schedule.  

## 2020-01-10 NOTE — Telephone Encounter (Signed)
Patient is scheduled at [facility] for 01/13/20.

## 2020-01-16 ENCOUNTER — Telehealth: Payer: Self-pay | Admitting: Neurology

## 2020-01-16 NOTE — Telephone Encounter (Signed)
Suarez,Peggy(wife on DPR) has called and she would like a call with the MRI results as soon as they are available.

## 2020-01-16 NOTE — Telephone Encounter (Signed)
Disc and report put in Dr.Sethi desk for review for Monday.

## 2020-01-20 ENCOUNTER — Telehealth (HOSPITAL_COMMUNITY): Payer: Self-pay | Admitting: Neurology

## 2020-01-20 NOTE — Telephone Encounter (Signed)
Pt's wife called needing provider or RN to call back and explain results. Please advise.

## 2020-01-20 NOTE — Telephone Encounter (Signed)
I called and left a message on the patient's mobile phone stating that I have received images and report of MR angiogram of the brain done at [facility] on 01/14/2020 which does not show definite left P-comm aneurysm which was previously seen on the CT angiogram.  There were no other concerning findings.  Mild narrowing of the posterior cerebral arteries noted bilaterally.  The difference between the previous CT angiogram report from 12/14/2017 of small aneurysm and the current MR angiogram not showing aneurysm was likely due to technical differences between the 2 studies.  He was asked to call me back if he had further questions

## 2020-01-20 NOTE — Telephone Encounter (Signed)
I returned the patient's wife's call and explained to her the discrepancy between the MR angiogram and CT angiogram results showing tiny aneurysm.  She voiced understanding.  She was advised to keep his scheduled appointment with me in a year

## 2020-01-21 NOTE — Telephone Encounter (Signed)
SEe Dr Pearlean Brownie note from 01/20/2020. This is a duplicate note.

## 2020-02-04 ENCOUNTER — Other Ambulatory Visit: Payer: Self-pay | Admitting: Neurology

## 2020-02-04 DIAGNOSIS — R42 Dizziness and giddiness: Secondary | ICD-10-CM

## 2020-09-22 DIAGNOSIS — H2512 Age-related nuclear cataract, left eye: Secondary | ICD-10-CM | POA: Diagnosis not present

## 2020-09-22 DIAGNOSIS — H25042 Posterior subcapsular polar age-related cataract, left eye: Secondary | ICD-10-CM | POA: Diagnosis not present

## 2020-09-22 DIAGNOSIS — H25812 Combined forms of age-related cataract, left eye: Secondary | ICD-10-CM | POA: Diagnosis not present

## 2020-11-06 DIAGNOSIS — E1151 Type 2 diabetes mellitus with diabetic peripheral angiopathy without gangrene: Secondary | ICD-10-CM | POA: Diagnosis not present

## 2020-11-06 DIAGNOSIS — M5137 Other intervertebral disc degeneration, lumbosacral region: Secondary | ICD-10-CM | POA: Diagnosis not present

## 2020-11-06 DIAGNOSIS — M7918 Myalgia, other site: Secondary | ICD-10-CM | POA: Diagnosis not present

## 2020-11-06 DIAGNOSIS — N182 Chronic kidney disease, stage 2 (mild): Secondary | ICD-10-CM | POA: Diagnosis not present

## 2020-11-06 DIAGNOSIS — I10 Essential (primary) hypertension: Secondary | ICD-10-CM | POA: Diagnosis not present

## 2020-11-06 DIAGNOSIS — I70213 Atherosclerosis of native arteries of extremities with intermittent claudication, bilateral legs: Secondary | ICD-10-CM | POA: Diagnosis not present

## 2020-11-06 DIAGNOSIS — E1122 Type 2 diabetes mellitus with diabetic chronic kidney disease: Secondary | ICD-10-CM | POA: Diagnosis not present

## 2020-11-06 DIAGNOSIS — E113293 Type 2 diabetes mellitus with mild nonproliferative diabetic retinopathy without macular edema, bilateral: Secondary | ICD-10-CM | POA: Diagnosis not present

## 2020-11-06 DIAGNOSIS — Z7984 Long term (current) use of oral hypoglycemic drugs: Secondary | ICD-10-CM | POA: Diagnosis not present

## 2020-12-18 DIAGNOSIS — M533 Sacrococcygeal disorders, not elsewhere classified: Secondary | ICD-10-CM | POA: Diagnosis not present

## 2021-01-07 ENCOUNTER — Ambulatory Visit: Payer: Medicare Other | Admitting: Neurology

## 2021-01-21 ENCOUNTER — Encounter: Payer: Self-pay | Admitting: Neurology

## 2021-01-21 ENCOUNTER — Ambulatory Visit: Payer: Medicare Other | Admitting: Neurology

## 2021-01-21 VITALS — BP 144/71 | Ht 73.0 in | Wt 179.0 lb

## 2021-01-21 DIAGNOSIS — I6990 Unspecified sequelae of unspecified cerebrovascular disease: Secondary | ICD-10-CM | POA: Diagnosis not present

## 2021-01-21 NOTE — Progress Notes (Signed)
[facility] [address] [place]. [place] [address] [phone]       OFFICE FOLLOW UP VISIT NOTE  Mr. [name] Date of Birth:  1940-10-18 Medical Record Number:  [id number]   Referring MD:  Carolin Sicks  Reason for Referral:  Vertigo  [id number] Consult 01/24/17 ;  [name] is a 59 year pleasant caucasian male seen for consultation today. He is accompanied by his wife. History is obtained from the patient, wife and review of electronic medical records. I have personally reviewed imaging films. He presented with sudden onset of vertigo while he was working out in the yard on 12/27/16. He felt all of a sudden objects started moving counterclockwise towards the left. When he started to walk he was leaning to the left but was able to hold on and walking make it to a chair and sat down. This vertigo persisted despite any specific head movements even with eyes closed. He denied any nausea, vertigo. He was evaluated in the emergency room by: neurohospitalist Dr Paschal Dopp.And thought to have benign paroxysmal positional vertigo. Dix-Hallpike maneuver demonstrated rotatory nystagmus to the right greater than left. Patient's symptoms lasted an hour and half and resolved. Patient had no prior history of tinnitus, vertigo or inner ear problems. He states his symptoms completely resolved and have not recurred since then. He had MRI scan of the brain which showed no definite acute infarct or vascular lesion. A small subcentimeter lesion was noted in the right mid temporal cortex showing faint enhancement and some gliosis and was felt to be of indeterminate etiology possibly a subacute infarct. 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 Patient has no known prior history of inner ear problems, tinnitus, hearing loss, ear infections or vertigo. He has no family history of  aneurysms and does not smoke and states blood pressure control has not been a problem. Update 05/16/2017 : He returns for follow-up after last visit 3-1/2 months ago. He states is doing a lot better and is no longer feeling dizzy. He does complain of mild posterior headaches for the last 2 months. He feels that is hurting mostly towards the back when his mind on a recliner. He describes a sense of heaviness and tightness and posterior neck and back of his head. He rarely needs to take medications as this is not disabling. He denies any nausea light or sound sensitivity. He remains on Plavix which is tolerating well without bleeding or bruising. His blood pressure is well controlled and today it is 138/71. Fasting malignancy was 6.8. Patient had MRI scan of the brain done with and without contrast with special attention to internal auditory canals on 02/07/17 at [facility] which are personally reviewed shows no structural lesion or tumor. There are mild changes of chronic microvascular ischemia but no acute abnormality. Update 11/27/2017 : He returns for follow-up after last visit 6 months ago. He is accompanied by his wife. He states is doing well and has not had any dizzy spells. Continues to have mild posterior headaches and neck pain particularly when his bleeding is that back in a certain position on the recliner. He feels that below it feels better. He denies any right leg pain. Is not had any recurrent vertigo episode either. He has been doing regular neck stretching exercises. Is tolerating Plavix well without bleeding or bruising. He states his blood pressure  is under good control and today it is 138/84. His fasting sugars have also been quite good. He is concerned about his brain aneurysm and wants follow-up CT angiogram to be done. Update 01/08/2020 : He returns for follow-up after last virtual video visit 1 year ago.  He states he is doing well and has had no recurrent episodes of  dizziness or vertigo.  His posterior occipital headaches and tension headaches also seem to have resolved.  He is overall had a good year without any recurrent stroke or TIA symptoms.  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  He and his wife both have gotten the Covid shots and had no issues with it.  He has no new complaints today. Update 01/21/2021: He returns for follow-up after last visit a year ago.  Is accompanied by his wife.  He states he is doing well.  He has had no further episodes of dizziness or vertigo.  He is not having any occipital headaches either.  He did undergo MR angiogram of the brain on 01/13/2020 at [facility] which actually showed no evidence of P-comm aneurysm.  There is moderate narrowing noted of the left posterior cerebral artery and mild of the right posterior cerebral artery in the P2 segments.  Patient denies any symptoms of stroke or TIA.  He remains on Plavix which is tolerating well with only minor bruising but no bleeding.  He is quite active and works outdoors in the garden.  Is lost about 10 pounds.  His last hemoglobin A1c was 7.6.  Blood pressure is well controlled today it is slightly elevated in the office at 144/71.  States his sugars are doing well.  He has no complaints. ROS:   14 system review of systems is positive for bruising, back pain headache dizziness only and all other systems negative  PMH:  Past Medical History:  Diagnosis Date  . Arthritis   . Diabetes mellitus without complication (HCC)   . GERD (gastroesophageal reflux disease) 12/26/2016  . History of kidney stones    Lithotripsy  . Hypertension   . 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 Marital status: Married    Spouse name: [name]  . Number of children: Not on file  . Years of  education: Not on file  . Highest education level: Not on file  Occupational History  . Not on file  Tobacco Use  . Smoking status: Never Smoker  . Smokeless tobacco: Never Used  Vaping Use  . Vaping Use: Never used  Substance and Sexual Activity  . Alcohol use: No  . Drug use: No  . Sexual activity: Not on file  Other Topics Concern  . Not on file  Social History Narrative   Lives with wife at home   Right Handed   Drinks no caffeine daily   Social Determinants of Health   Financial Resource Strain: Not on file  Food Insecurity: Not on file  Transportation Needs: Not on file  Physical Activity: Not on file  Stress: Not on file  Social Connections: Not on file  Intimate Partner Violence: Not on file    Medications:   Current Outpatient Medications on File Prior to Visit  Medication Sig Dispense Refill  . ALPRAZolam Prudy Feeler)  0.5 MG tablet Take 0.25 mg by mouth at bedtime.    Marland Kitchen amLODipine (NORVASC) 10 MG tablet Take 10 mg by mouth daily.     . clopidogrel (PLAVIX) 75 MG tablet TAKE 1 TABLET BY MOUTH EVERY DAY 90 tablet 3  . ezetimibe (ZETIA) 10 MG tablet Take 10 mg by mouth daily.    Marland Kitchen glimepiride (AMARYL) 1 MG tablet Take 1 mg by mouth daily.     Marland Kitchen LORazepam (ATIVAN) 1 MG tablet TAKE 1/2 TABLET AT BEDTIME AND ONE LATER IN EARLY A.M. AS NEEDED ONCE A DAY ORALLY 90 DAYS    . losartan-hydrochlorothiazide (HYZAAR) 50-12.5 MG tablet Take 1 tablet by mouth daily.    . meclizine (ANTIVERT) 25 MG tablet Take 1 tablet (25 mg total) by mouth 3 (three) times daily as needed for dizziness. 30 tablet 0  . metFORMIN (GLUCOPHAGE) 500 MG tablet Take 500 mg by mouth 2 (two) times daily with a meal.    . Multiple Vitamin (MULTIVITAMIN WITH MINERALS) TABS Take 1 tablet by mouth daily.    . pioglitazone (ACTOS) 15 MG tablet Take 15 mg by mouth daily.    . ranitidine (ZANTAC) 150 MG tablet Take 150 mg by mouth 2 (two) times daily.    Marland Kitchen HYDROcodone-acetaminophen (NORCO/VICODIN) 5-325 MG tablet  Take 1-2 tablets by mouth every 4 (four) hours as needed (pain). 40 tablet 0   No current facility-administered medications on file prior to visit.    Allergies:  No Known Allergies  Physical Exam General: Pleasant elderly Caucasian male, seated, in no evident distress Head: head normocephalic and atraumatic.   Neck: supple with no carotid or supraclavicular bruits Cardiovascular: regular rate and rhythm, no murmurs Musculoskeletal: no deformity. Mild spasm of posterior neck muscles with restriction of movements in all directions. Skin:  no rash/petichiae Vascular:  Normal pulses all extremities  Neurologic Exam Mental Status: Awake and fully alert. Oriented to place and time. Recent and remote memory intact. Attention span, concentration and fund of knowledge appropriate. Mood and affect appropriate.  Cranial Nerves: Fundoscopic exam  not done.  Pupils equal, briskly reactive to light. Extraocular movements full without nystagmus. Visual fields full to confrontation. Hearing intact. Facial sensation intact. Face, tongue, palate moves normally and symmetrically.  Motor: Normal bulk and tone. Normal strength in all tested extremity muscles. Sensory.: intact to touch , pinprick , position and vibratory sensation.  Coordination: Rapid alternating movements normal in all extremities. Finger-to-nose and heel-to-shin performed accurately bilaterally. Gait and Station: Arises from chair without difficulty. Stance is normal. Gait demonstrates normal stride length and balance . Able to heel, toe and tandem walk with mild difficulty.  Reflexes: 1+ and symmetric. Toes downgoing.       ASSESSMENT: 52 year Caucasian male with sudden onset of transient isolated vertigo without accompanying signs of unclear etiology. 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  Described on CT angio but not seen on subsequent MR angiogram on  01/13/2020.   PLAN: I had a long d/w patient and his wife about his remote vertigo possible cerebral aneurysm risk for recurrent stroke/TIAs, personally independently reviewed imaging studies and stroke evaluation results and answered questions.we also discussed results of his MR angiogram not showing a definite cerebral aneurysm continue Plavix 75 mg daily for secondary stroke prevention and maintain strict control of hypertension with blood pressure goal below 130/90, diabetes with hemoglobin A1c goal below 6.5% and lipids with LDL cholesterol goal below 70 mg/dL. I  also advised the patient to eat a healthy diet with plenty of whole grains, cereals, fruits and vegetables, exercise regularly and maintain ideal body weight Followup in the future with me only as necessary and no routine schedule appointment was made..Greater than 50% time during this 25 minute visit was spent on counseling and coordination of care about his  dizziness and asymptomatic cerebral aneurysm and answering questions Delia Heady, MD  [facility] [address] [place], [place] [address]  Phone [phone] Fax [phone] Note: This document was prepared with digital dictation and possible smart phrase technology. Any transcriptional errors that result from this process are unintentional.

## 2021-01-21 NOTE — Patient Instructions (Addendum)
I had a long d/w patient and his wife about his remote vertigo possible cerebral aneurysm risk for recurrent stroke/TIAs, personally independently reviewed imaging studies and stroke evaluation results and answered questions.we also discussed results of his MR angiogram not showing a definite cerebral aneurysm continue Plavix 75 mg daily for secondary stroke prevention and maintain strict control of hypertension with blood pressure goal below 130/90, diabetes with hemoglobin A1c goal below 6.5% and lipids with LDL cholesterol goal below 70 mg/dL. I also advised the patient to eat a healthy diet with plenty of whole grains, cereals, fruits and vegetables, exercise regularly and maintain ideal body weight Followup in the future with me only as necessary and no routine schedule appointment was made.

## 2021-01-27 ENCOUNTER — Other Ambulatory Visit: Payer: Self-pay | Admitting: Neurology

## 2021-01-27 DIAGNOSIS — R42 Dizziness and giddiness: Secondary | ICD-10-CM

## 2021-03-15 ENCOUNTER — Ambulatory Visit: Payer: Medicare Other | Admitting: Podiatry

## 2021-03-15 ENCOUNTER — Other Ambulatory Visit: Payer: Self-pay

## 2021-03-15 ENCOUNTER — Ambulatory Visit (INDEPENDENT_AMBULATORY_CARE_PROVIDER_SITE_OTHER): Payer: Medicare Other

## 2021-03-15 DIAGNOSIS — M722 Plantar fascial fibromatosis: Secondary | ICD-10-CM

## 2021-03-15 MED ORDER — TRIAMCINOLONE ACETONIDE 10 MG/ML IJ SUSP
10.0000 mg | Freq: Once | INTRAMUSCULAR | Status: AC
  Administered 2021-03-15: 10 mg

## 2021-03-15 NOTE — Patient Instructions (Signed)

## 2021-03-15 NOTE — Progress Notes (Signed)
Subjective:   Patient ID: [name], male   DOB: 80 y.o.   MRN: [id number]   HPI Patient is developed intense discomfort plantar aspect right heel at the insertional point tendon into the calcaneus with inflammation fluid around the medial band.  Patient's noted to have this for over a month patient states that he walks with a normal heel toe gait pattern but is limping now on the right foot and does not smoke likes to be active   Review of Systems  All other systems reviewed and are negative.      Objective:  Physical Exam Vitals and nursing note reviewed.  Constitutional:      Appearance: He is well-developed.  Pulmonary:     Effort: Pulmonary effort is normal.  Musculoskeletal:        General: Normal range of motion.  Skin:    General: Skin is warm.  Neurological:     Mental Status: He is alert.    Neurovascular status intact muscle strength adequate range of motion adequate with exquisite discomfort plantar aspect right heel at the insertional point of the tendon into the calcaneus with inflammation fluid around the medial band.  Patient is found to have good digital perfusion well oriented x3     Assessment:  Acute Planter fasciitis right with inflammation fluid at the insertion     Plan:  H&P x-ray reviewed sterile prep injected the plantar fascial right 3 mg Kenalog 5 mg Xylocaine instructed on physical therapy support and utilization of brace which was dispensed today and reappoint 2 weeks  X-rays indicate there is small spur no indications of stress fracture arthritis

## 2021-04-02 ENCOUNTER — Ambulatory Visit: Payer: Medicare Other | Admitting: Podiatry

## 2021-04-02 ENCOUNTER — Encounter: Payer: Self-pay | Admitting: Podiatry

## 2021-04-02 ENCOUNTER — Other Ambulatory Visit: Payer: Self-pay

## 2021-04-02 DIAGNOSIS — M722 Plantar fascial fibromatosis: Secondary | ICD-10-CM

## 2021-04-02 MED ORDER — TRIAMCINOLONE ACETONIDE 10 MG/ML IJ SUSP
10.0000 mg | Freq: Once | INTRAMUSCULAR | Status: AC
  Administered 2021-04-02: 10 mg

## 2021-04-02 NOTE — Progress Notes (Signed)
Subjective:   Patient ID: [name], male   DOB: 80 y.o.   MRN: [id number]   HPI Patient states doing good but still has 1 area of discomfort plantar aspect right heel   ROS      Objective:  Physical Exam  Neurovascular status intact with discomfort right plantar fascial improved but present     Assessment:  Planter fasciitis right improved present     Plan:  Sterile prep we injected the plantar fascial 1 more time 3 mg Kenalog 5 mg Xylocaine advised on support shoes and stretching reappoint to recheck

## 2021-04-14 DIAGNOSIS — N182 Chronic kidney disease, stage 2 (mild): Secondary | ICD-10-CM | POA: Diagnosis not present

## 2021-04-14 DIAGNOSIS — E1151 Type 2 diabetes mellitus with diabetic peripheral angiopathy without gangrene: Secondary | ICD-10-CM | POA: Diagnosis not present

## 2021-04-14 DIAGNOSIS — Z789 Other specified health status: Secondary | ICD-10-CM | POA: Diagnosis not present

## 2021-04-14 DIAGNOSIS — E113293 Type 2 diabetes mellitus with mild nonproliferative diabetic retinopathy without macular edema, bilateral: Secondary | ICD-10-CM | POA: Diagnosis not present

## 2021-04-14 DIAGNOSIS — Z1389 Encounter for screening for other disorder: Secondary | ICD-10-CM | POA: Diagnosis not present

## 2021-04-14 DIAGNOSIS — E1122 Type 2 diabetes mellitus with diabetic chronic kidney disease: Secondary | ICD-10-CM | POA: Diagnosis not present

## 2021-04-14 DIAGNOSIS — I10 Essential (primary) hypertension: Secondary | ICD-10-CM | POA: Diagnosis not present

## 2021-04-14 DIAGNOSIS — Z0000 Encounter for general adult medical examination without abnormal findings: Secondary | ICD-10-CM | POA: Diagnosis not present

## 2021-04-14 DIAGNOSIS — I739 Peripheral vascular disease, unspecified: Secondary | ICD-10-CM | POA: Diagnosis not present

## 2021-04-14 DIAGNOSIS — K219 Gastro-esophageal reflux disease without esophagitis: Secondary | ICD-10-CM | POA: Diagnosis not present

## 2021-04-14 DIAGNOSIS — E7800 Pure hypercholesterolemia, unspecified: Secondary | ICD-10-CM | POA: Diagnosis not present

## 2021-06-06 ENCOUNTER — Other Ambulatory Visit: Payer: Self-pay | Admitting: Neurology

## 2021-06-06 DIAGNOSIS — R42 Dizziness and giddiness: Secondary | ICD-10-CM

## 2021-06-08 ENCOUNTER — Telehealth: Payer: Self-pay | Admitting: Neurology

## 2021-06-08 NOTE — Telephone Encounter (Signed)
Reviewed patient's chart. He was last seen 01/21/21 with the plan for him to follow up with his PCP routinely, only returning here if needed. For this reason, we would have his PCP take over his medication refills. I left this information on voicemail. I provided our number to call back if they have further questions or run into any issues.

## 2021-06-08 NOTE — Telephone Encounter (Signed)
Pt's wife, Jesselee Poth (on [place]) called, pharmacy informed me physician refused to fill clopidogrel (PLAVIX) 75 MG tablet. Want to know if he is to stop taking this medication. Would like a call from the nurse

## 2021-08-09 DIAGNOSIS — L570 Actinic keratosis: Secondary | ICD-10-CM | POA: Diagnosis not present

## 2021-08-09 DIAGNOSIS — L111 Transient acantholytic dermatosis [Grover]: Secondary | ICD-10-CM | POA: Diagnosis not present

## 2021-08-09 DIAGNOSIS — Z85828 Personal history of other malignant neoplasm of skin: Secondary | ICD-10-CM | POA: Diagnosis not present

## 2021-08-18 DIAGNOSIS — H31002 Unspecified chorioretinal scars, left eye: Secondary | ICD-10-CM | POA: Diagnosis not present

## 2021-08-18 DIAGNOSIS — E119 Type 2 diabetes mellitus without complications: Secondary | ICD-10-CM | POA: Diagnosis not present

## 2021-08-18 DIAGNOSIS — H35372 Puckering of macula, left eye: Secondary | ICD-10-CM | POA: Diagnosis not present

## 2021-08-18 DIAGNOSIS — H2511 Age-related nuclear cataract, right eye: Secondary | ICD-10-CM | POA: Diagnosis not present

## 2021-10-06 DIAGNOSIS — M7918 Myalgia, other site: Secondary | ICD-10-CM | POA: Diagnosis not present

## 2021-10-06 DIAGNOSIS — I10 Essential (primary) hypertension: Secondary | ICD-10-CM | POA: Diagnosis not present

## 2021-10-06 DIAGNOSIS — E1122 Type 2 diabetes mellitus with diabetic chronic kidney disease: Secondary | ICD-10-CM | POA: Diagnosis not present

## 2021-10-06 DIAGNOSIS — I739 Peripheral vascular disease, unspecified: Secondary | ICD-10-CM | POA: Diagnosis not present

## 2021-10-06 DIAGNOSIS — N182 Chronic kidney disease, stage 2 (mild): Secondary | ICD-10-CM | POA: Diagnosis not present

## 2021-10-06 DIAGNOSIS — E1151 Type 2 diabetes mellitus with diabetic peripheral angiopathy without gangrene: Secondary | ICD-10-CM | POA: Diagnosis not present

## 2021-10-12 DIAGNOSIS — M7918 Myalgia, other site: Secondary | ICD-10-CM | POA: Diagnosis not present

## 2021-10-21 DIAGNOSIS — R300 Dysuria: Secondary | ICD-10-CM | POA: Diagnosis not present

## 2022-03-02 ENCOUNTER — Inpatient Hospital Stay (HOSPITAL_COMMUNITY)
Admission: EM | Admit: 2022-03-02 | Discharge: 2022-03-19 | DRG: 308 | Disposition: E | Payer: Medicare Other | Attending: Pulmonary Disease | Admitting: Pulmonary Disease

## 2022-03-02 ENCOUNTER — Inpatient Hospital Stay (HOSPITAL_COMMUNITY): Payer: Medicare Other

## 2022-03-02 ENCOUNTER — Other Ambulatory Visit: Payer: Self-pay

## 2022-03-02 ENCOUNTER — Emergency Department (HOSPITAL_COMMUNITY): Payer: Medicare Other

## 2022-03-02 DIAGNOSIS — S0081XA Abrasion of other part of head, initial encounter: Secondary | ICD-10-CM | POA: Diagnosis present

## 2022-03-02 DIAGNOSIS — S0993XA Unspecified injury of face, initial encounter: Secondary | ICD-10-CM | POA: Diagnosis not present

## 2022-03-02 DIAGNOSIS — S12000A Unspecified displaced fracture of first cervical vertebra, initial encounter for closed fracture: Secondary | ICD-10-CM | POA: Diagnosis present

## 2022-03-02 DIAGNOSIS — I499 Cardiac arrhythmia, unspecified: Secondary | ICD-10-CM | POA: Diagnosis not present

## 2022-03-02 DIAGNOSIS — N182 Chronic kidney disease, stage 2 (mild): Secondary | ICD-10-CM | POA: Diagnosis not present

## 2022-03-02 DIAGNOSIS — M4802 Spinal stenosis, cervical region: Secondary | ICD-10-CM | POA: Diagnosis present

## 2022-03-02 DIAGNOSIS — G40409 Other generalized epilepsy and epileptic syndromes, not intractable, without status epilepticus: Secondary | ICD-10-CM | POA: Diagnosis not present

## 2022-03-02 DIAGNOSIS — M532X2 Spinal instabilities, cervical region: Secondary | ICD-10-CM | POA: Diagnosis not present

## 2022-03-02 DIAGNOSIS — E872 Acidosis, unspecified: Secondary | ICD-10-CM | POA: Diagnosis not present

## 2022-03-02 DIAGNOSIS — I671 Cerebral aneurysm, nonruptured: Secondary | ICD-10-CM | POA: Diagnosis not present

## 2022-03-02 DIAGNOSIS — Z8673 Personal history of transient ischemic attack (TIA), and cerebral infarction without residual deficits: Secondary | ICD-10-CM | POA: Diagnosis not present

## 2022-03-02 DIAGNOSIS — G253 Myoclonus: Secondary | ICD-10-CM | POA: Diagnosis present

## 2022-03-02 DIAGNOSIS — R404 Transient alteration of awareness: Secondary | ICD-10-CM | POA: Diagnosis not present

## 2022-03-02 DIAGNOSIS — I462 Cardiac arrest due to underlying cardiac condition: Secondary | ICD-10-CM | POA: Diagnosis present

## 2022-03-02 DIAGNOSIS — G8252 Quadriplegia, C1-C4 incomplete: Secondary | ICD-10-CM | POA: Diagnosis not present

## 2022-03-02 DIAGNOSIS — R001 Bradycardia, unspecified: Secondary | ICD-10-CM | POA: Diagnosis not present

## 2022-03-02 DIAGNOSIS — Z66 Do not resuscitate: Secondary | ICD-10-CM | POA: Diagnosis not present

## 2022-03-02 DIAGNOSIS — R55 Syncope and collapse: Secondary | ICD-10-CM | POA: Diagnosis not present

## 2022-03-02 DIAGNOSIS — M47812 Spondylosis without myelopathy or radiculopathy, cervical region: Secondary | ICD-10-CM | POA: Diagnosis not present

## 2022-03-02 DIAGNOSIS — I469 Cardiac arrest, cause unspecified: Secondary | ICD-10-CM

## 2022-03-02 DIAGNOSIS — R569 Unspecified convulsions: Secondary | ICD-10-CM | POA: Diagnosis present

## 2022-03-02 DIAGNOSIS — Y92007 Garden or yard of unspecified non-institutional (private) residence as the place of occurrence of the external cause: Secondary | ICD-10-CM | POA: Diagnosis not present

## 2022-03-02 DIAGNOSIS — I441 Atrioventricular block, second degree: Principal | ICD-10-CM | POA: Diagnosis present

## 2022-03-02 DIAGNOSIS — M1990 Unspecified osteoarthritis, unspecified site: Secondary | ICD-10-CM | POA: Diagnosis present

## 2022-03-02 DIAGNOSIS — K219 Gastro-esophageal reflux disease without esophagitis: Secondary | ICD-10-CM | POA: Diagnosis present

## 2022-03-02 DIAGNOSIS — Z87442 Personal history of urinary calculi: Secondary | ICD-10-CM

## 2022-03-02 DIAGNOSIS — Z8249 Family history of ischemic heart disease and other diseases of the circulatory system: Secondary | ICD-10-CM

## 2022-03-02 DIAGNOSIS — Z885 Allergy status to narcotic agent status: Secondary | ICD-10-CM | POA: Diagnosis not present

## 2022-03-02 DIAGNOSIS — S129XXA Fracture of neck, unspecified, initial encounter: Secondary | ICD-10-CM

## 2022-03-02 DIAGNOSIS — I129 Hypertensive chronic kidney disease with stage 1 through stage 4 chronic kidney disease, or unspecified chronic kidney disease: Secondary | ICD-10-CM | POA: Diagnosis present

## 2022-03-02 DIAGNOSIS — Z79899 Other long term (current) drug therapy: Secondary | ICD-10-CM

## 2022-03-02 DIAGNOSIS — M532X1 Spinal instabilities, occipito-atlanto-axial region: Secondary | ICD-10-CM | POA: Diagnosis not present

## 2022-03-02 DIAGNOSIS — W19XXXA Unspecified fall, initial encounter: Secondary | ICD-10-CM | POA: Diagnosis present

## 2022-03-02 DIAGNOSIS — I6523 Occlusion and stenosis of bilateral carotid arteries: Secondary | ICD-10-CM | POA: Diagnosis not present

## 2022-03-02 DIAGNOSIS — R9082 White matter disease, unspecified: Secondary | ICD-10-CM | POA: Diagnosis not present

## 2022-03-02 DIAGNOSIS — N179 Acute kidney failure, unspecified: Secondary | ICD-10-CM | POA: Diagnosis present

## 2022-03-02 DIAGNOSIS — E1151 Type 2 diabetes mellitus with diabetic peripheral angiopathy without gangrene: Secondary | ICD-10-CM | POA: Diagnosis present

## 2022-03-02 DIAGNOSIS — S12111A Posterior displaced Type II dens fracture, initial encounter for closed fracture: Secondary | ICD-10-CM | POA: Diagnosis present

## 2022-03-02 DIAGNOSIS — G931 Anoxic brain damage, not elsewhere classified: Secondary | ICD-10-CM | POA: Diagnosis not present

## 2022-03-02 DIAGNOSIS — S12112A Nondisplaced Type II dens fracture, initial encounter for closed fracture: Secondary | ICD-10-CM | POA: Diagnosis not present

## 2022-03-02 DIAGNOSIS — Z515 Encounter for palliative care: Secondary | ICD-10-CM | POA: Diagnosis not present

## 2022-03-02 DIAGNOSIS — J9601 Acute respiratory failure with hypoxia: Secondary | ICD-10-CM | POA: Diagnosis present

## 2022-03-02 DIAGNOSIS — E1122 Type 2 diabetes mellitus with diabetic chronic kidney disease: Secondary | ICD-10-CM | POA: Diagnosis not present

## 2022-03-02 DIAGNOSIS — Z743 Need for continuous supervision: Secondary | ICD-10-CM | POA: Diagnosis not present

## 2022-03-02 DIAGNOSIS — Z818 Family history of other mental and behavioral disorders: Secondary | ICD-10-CM

## 2022-03-02 DIAGNOSIS — Z881 Allergy status to other antibiotic agents status: Secondary | ICD-10-CM

## 2022-03-02 DIAGNOSIS — G8250 Quadriplegia, unspecified: Secondary | ICD-10-CM | POA: Diagnosis not present

## 2022-03-02 DIAGNOSIS — Z7984 Long term (current) use of oral hypoglycemic drugs: Secondary | ICD-10-CM

## 2022-03-02 DIAGNOSIS — J320 Chronic maxillary sinusitis: Secondary | ICD-10-CM | POA: Diagnosis not present

## 2022-03-02 DIAGNOSIS — S0031XA Abrasion of nose, initial encounter: Secondary | ICD-10-CM | POA: Diagnosis present

## 2022-03-02 DIAGNOSIS — Z888 Allergy status to other drugs, medicaments and biological substances status: Secondary | ICD-10-CM

## 2022-03-02 DIAGNOSIS — S12190A Other displaced fracture of second cervical vertebra, initial encounter for closed fracture: Secondary | ICD-10-CM | POA: Diagnosis not present

## 2022-03-02 DIAGNOSIS — Z7902 Long term (current) use of antithrombotics/antiplatelets: Secondary | ICD-10-CM

## 2022-03-02 DIAGNOSIS — S0291XA Unspecified fracture of skull, initial encounter for closed fracture: Secondary | ICD-10-CM | POA: Diagnosis not present

## 2022-03-02 LAB — I-STAT CHEM 8, ED
BUN: 18 mg/dL (ref 8–23)
Calcium, Ion: 1.14 mmol/L — ABNORMAL LOW (ref 1.15–1.40)
Chloride: 101 mmol/L (ref 98–111)
Creatinine, Ser: 1.2 mg/dL (ref 0.61–1.24)
Glucose, Bld: 291 mg/dL — ABNORMAL HIGH (ref 70–99)
HCT: 43 % (ref 39.0–52.0)
Hemoglobin: 14.6 g/dL (ref 13.0–17.0)
Potassium: 3.9 mmol/L (ref 3.5–5.1)
Sodium: 138 mmol/L (ref 135–145)
TCO2: 25 mmol/L (ref 22–32)

## 2022-03-02 LAB — I-STAT ARTERIAL BLOOD GAS, ED
Acid-base deficit: 3 mmol/L — ABNORMAL HIGH (ref 0.0–2.0)
Bicarbonate: 23.3 mmol/L (ref 20.0–28.0)
Calcium, Ion: 1.16 mmol/L (ref 1.15–1.40)
HCT: 36 % — ABNORMAL LOW (ref 39.0–52.0)
Hemoglobin: 12.2 g/dL — ABNORMAL LOW (ref 13.0–17.0)
O2 Saturation: 100 %
Patient temperature: 96.5
Potassium: 3.6 mmol/L (ref 3.5–5.1)
Sodium: 138 mmol/L (ref 135–145)
TCO2: 25 mmol/L (ref 22–32)
pCO2 arterial: 41.4 mmHg (ref 32–48)
pH, Arterial: 7.353 (ref 7.35–7.45)
pO2, Arterial: 325 mmHg — ABNORMAL HIGH (ref 83–108)

## 2022-03-02 LAB — CBG MONITORING, ED: Glucose-Capillary: 224 mg/dL — ABNORMAL HIGH (ref 70–99)

## 2022-03-02 LAB — COMPREHENSIVE METABOLIC PANEL
ALT: 62 U/L — ABNORMAL HIGH (ref 0–44)
AST: 70 U/L — ABNORMAL HIGH (ref 15–41)
Albumin: 3.9 g/dL (ref 3.5–5.0)
Alkaline Phosphatase: 72 U/L (ref 38–126)
Anion gap: 13 (ref 5–15)
BUN: 17 mg/dL (ref 8–23)
CO2: 23 mmol/L (ref 22–32)
Calcium: 8.8 mg/dL — ABNORMAL LOW (ref 8.9–10.3)
Chloride: 102 mmol/L (ref 98–111)
Creatinine, Ser: 1.48 mg/dL — ABNORMAL HIGH (ref 0.61–1.24)
GFR, Estimated: 48 mL/min — ABNORMAL LOW (ref >60)
Glucose, Bld: 291 mg/dL — ABNORMAL HIGH (ref 70–99)
Potassium: 3.9 mmol/L (ref 3.5–5.1)
Sodium: 138 mmol/L (ref 135–145)
Total Bilirubin: 0.6 mg/dL (ref 0.3–1.2)
Total Protein: 6.5 g/dL (ref 6.5–8.1)

## 2022-03-02 LAB — HEMOGLOBIN A1C
Hgb A1c MFr Bld: 6.5 % — ABNORMAL HIGH (ref 4.8–5.6)
Mean Plasma Glucose: 139.85 mg/dL

## 2022-03-02 LAB — LACTIC ACID, PLASMA
Lactic Acid, Venous: 6.9 mmol/L (ref 0.5–1.9)
Lactic Acid, Venous: 3 mmol/L (ref 0.5–1.9)

## 2022-03-02 LAB — ECHOCARDIOGRAM COMPLETE
Area-P 1/2: 3.39 cm2
Height: 73 in
S' Lateral: 2.4 cm
Weight: 2864 oz

## 2022-03-02 LAB — ETHANOL: Alcohol, Ethyl (B): <10 mg/dL (ref <10)

## 2022-03-02 LAB — CBC WITH DIFFERENTIAL/PLATELET
Abs Immature Granulocytes: 0.23 10*3/uL — ABNORMAL HIGH (ref 0.00–0.07)
Basophils Absolute: 0 10*3/uL (ref 0.0–0.1)
Basophils Relative: 0 %
Eosinophils Absolute: 0.2 10*3/uL (ref 0.0–0.5)
Eosinophils Relative: 2 %
HCT: 44.7 % (ref 39.0–52.0)
Hemoglobin: 14.7 g/dL (ref 13.0–17.0)
Immature Granulocytes: 2 %
Lymphocytes Relative: 27 %
Lymphs Abs: 2.8 10*3/uL (ref 0.7–4.0)
MCH: 31 pg (ref 26.0–34.0)
MCHC: 32.9 g/dL (ref 30.0–36.0)
MCV: 94.3 fL (ref 80.0–100.0)
Monocytes Absolute: 1.1 10*3/uL — ABNORMAL HIGH (ref 0.1–1.0)
Monocytes Relative: 11 %
Neutro Abs: 6 10*3/uL (ref 1.7–7.7)
Neutrophils Relative %: 58 %
Platelets: 209 10*3/uL (ref 150–400)
RBC: 4.74 MIL/uL (ref 4.22–5.81)
RDW: 12.7 % (ref 11.5–15.5)
WBC: 10.4 10*3/uL (ref 4.0–10.5)
nRBC: 0 % (ref 0.0–0.2)

## 2022-03-02 LAB — POCT I-STAT 7, (LYTES, BLD GAS, ICA,H+H)
Acid-Base Excess: 0 mmol/L (ref 0.0–2.0)
Bicarbonate: 22.9 mmol/L (ref 20.0–28.0)
Calcium, Ion: 1.14 mmol/L — ABNORMAL LOW (ref 1.15–1.40)
HCT: 38 % — ABNORMAL LOW (ref 39.0–52.0)
Hemoglobin: 12.9 g/dL — ABNORMAL LOW (ref 13.0–17.0)
O2 Saturation: 99 %
Patient temperature: 97.1
Potassium: 3 mmol/L — ABNORMAL LOW (ref 3.5–5.1)
Sodium: 138 mmol/L (ref 135–145)
TCO2: 24 mmol/L (ref 22–32)
pCO2 arterial: 30.5 mmHg — ABNORMAL LOW (ref 32–48)
pH, Arterial: 7.479 — ABNORMAL HIGH (ref 7.35–7.45)
pO2, Arterial: 102 mmHg (ref 83–108)

## 2022-03-02 LAB — GLUCOSE, CAPILLARY: Glucose-Capillary: 255 mg/dL — ABNORMAL HIGH (ref 70–99)

## 2022-03-02 LAB — MRSA NEXT GEN BY PCR, NASAL: MRSA by PCR Next Gen: NOT DETECTED

## 2022-03-02 LAB — MAGNESIUM: Magnesium: 2 mg/dL (ref 1.7–2.4)

## 2022-03-02 LAB — TROPONIN I (HIGH SENSITIVITY)
Troponin I (High Sensitivity): 17 ng/L (ref <18)
Troponin I (High Sensitivity): 94 ng/L — ABNORMAL HIGH (ref <18)

## 2022-03-02 MED ORDER — ROCURONIUM BROMIDE 50 MG/5ML IV SOLN
INTRAVENOUS | Status: AC | PRN
  Administered 2022-03-02: 70 mg via INTRAVENOUS

## 2022-03-02 MED ORDER — GLYCOPYRROLATE 0.2 MG/ML IJ SOLN: 0.2000 mg | INTRAMUSCULAR | Status: DC | PRN

## 2022-03-02 MED ORDER — PROPOFOL 1000 MG/100ML IV EMUL: 0.0000 ug/kg/min | INTRAVENOUS | Status: DC

## 2022-03-02 MED ORDER — DOCUSATE SODIUM 50 MG/5ML PO LIQD: 100.0000 mg | Freq: Two times a day (BID) | ORAL | Status: DC

## 2022-03-02 MED ORDER — PANTOPRAZOLE 2 MG/ML SUSPENSION
40.0000 mg | Freq: Every day | ORAL | Status: DC
  Filled 2022-03-02: qty 20

## 2022-03-02 MED ORDER — ACETAMINOPHEN 325 MG PO TABS: 650.0000 mg | ORAL_TABLET | ORAL | Status: DC | PRN

## 2022-03-02 MED ORDER — ONDANSETRON HCL 4 MG/2ML IJ SOLN: 4.0000 mg | Freq: Four times a day (QID) | INTRAMUSCULAR | Status: DC | PRN

## 2022-03-02 MED ORDER — ACETAMINOPHEN 160 MG/5ML PO SOLN: 650.0000 mg | ORAL | Status: DC | PRN

## 2022-03-02 MED ORDER — PANTOPRAZOLE SODIUM 40 MG IV SOLR: 40.0000 mg | Freq: Every day | INTRAVENOUS | Status: DC

## 2022-03-02 MED ORDER — ATROPINE SULFATE 1 MG/10ML IJ SOSY
PREFILLED_SYRINGE | INTRAMUSCULAR | Status: AC
  Filled 2022-03-02: qty 10

## 2022-03-02 MED ORDER — DIPHENHYDRAMINE HCL 50 MG/ML IJ SOLN: 25.0000 mg | INTRAMUSCULAR | Status: DC | PRN

## 2022-03-02 MED ORDER — FENTANYL BOLUS VIA INFUSION: 100.0000 ug | INTRAVENOUS | Status: DC | PRN

## 2022-03-02 MED ORDER — PROPOFOL 1000 MG/100ML IV EMUL
INTRAVENOUS | Status: AC
  Administered 2022-03-02: 5 ug/kg/min via INTRAVENOUS
  Filled 2022-03-02: qty 100

## 2022-03-02 MED ORDER — FENTANYL 2500MCG IN NS 250ML (10MCG/ML) PREMIX INFUSION: 0.0000 ug/h | INTRAVENOUS | Status: DC

## 2022-03-02 MED ORDER — POLYVINYL ALCOHOL 1.4 % OP SOLN: 1.0000 [drp] | Freq: Four times a day (QID) | OPHTHALMIC | Status: DC | PRN

## 2022-03-02 MED ORDER — FENTANYL CITRATE PF 50 MCG/ML IJ SOSY: 50.0000 ug | PREFILLED_SYRINGE | INTRAMUSCULAR | Status: DC | PRN

## 2022-03-02 MED ORDER — FENTANYL CITRATE PF 50 MCG/ML IJ SOSY
25.0000 ug | PREFILLED_SYRINGE | INTRAMUSCULAR | Status: DC | PRN
  Administered 2022-03-02: 25 ug via INTRAVENOUS
  Filled 2022-03-02: qty 1

## 2022-03-02 MED ORDER — LACTATED RINGERS IV BOLUS
1000.0000 mL | Freq: Once | INTRAVENOUS | Status: AC
  Administered 2022-03-02: 1000 mL via INTRAVENOUS

## 2022-03-02 MED ORDER — ETOMIDATE 2 MG/ML IV SOLN
INTRAVENOUS | Status: AC | PRN
  Administered 2022-03-02: 10 mg via INTRAVENOUS

## 2022-03-02 MED ORDER — SODIUM CHLORIDE 0.9 % IV SOLN: 250.0000 mL | INTRAVENOUS | Status: DC

## 2022-03-02 MED ORDER — POLYETHYLENE GLYCOL 3350 17 G PO PACK: 17.0000 g | PACK | Freq: Every day | ORAL | Status: DC

## 2022-03-02 MED ORDER — DOPAMINE-DEXTROSE 3.2-5 MG/ML-% IV SOLN
0.0000 ug/kg/min | INTRAVENOUS | Status: DC
  Administered 2022-03-02: 5 ug/kg/min via INTRAVENOUS
  Filled 2022-03-02: qty 250

## 2022-03-02 MED ORDER — HEPARIN SODIUM (PORCINE) 5000 UNIT/ML IJ SOLN: 5000.0000 [IU] | Freq: Three times a day (TID) | INTRAMUSCULAR | Status: DC

## 2022-03-02 MED ORDER — GLYCOPYRROLATE 1 MG PO TABS: 1.0000 mg | ORAL_TABLET | ORAL | Status: DC | PRN

## 2022-03-02 MED ORDER — ATROPINE SULFATE 1 MG/10ML IJ SOSY
PREFILLED_SYRINGE | INTRAMUSCULAR | Status: AC
  Administered 2022-03-02: 1 mg
  Filled 2022-03-02: qty 10

## 2022-03-02 MED ORDER — ACETAMINOPHEN 325 MG PO TABS: 650.0000 mg | ORAL_TABLET | Freq: Four times a day (QID) | ORAL | Status: DC | PRN

## 2022-03-02 MED ORDER — FENTANYL 2500MCG IN NS 250ML (10MCG/ML) PREMIX INFUSION
0.0000 ug/h | INTRAVENOUS | Status: DC
  Administered 2022-03-02: 25 ug/h via INTRAVENOUS
  Filled 2022-03-02: qty 250

## 2022-03-02 MED ORDER — ATROPINE SULFATE 1 MG/10ML IJ SOSY: 1.0000 mg | PREFILLED_SYRINGE | INTRAMUSCULAR | Status: DC | PRN

## 2022-03-02 MED ORDER — INSULIN ASPART 100 UNIT/ML IJ SOLN
0.0000 [IU] | INTRAMUSCULAR | Status: DC
  Administered 2022-03-02: 5 [IU] via SUBCUTANEOUS

## 2022-03-02 MED ORDER — NOREPINEPHRINE 4 MG/250ML-% IV SOLN
2.0000 ug/min | INTRAVENOUS | Status: DC
  Administered 2022-03-02: 2 ug/min via INTRAVENOUS
  Filled 2022-03-02: qty 250

## 2022-03-02 MED ORDER — ACETAMINOPHEN 650 MG RE SUPP: 650.0000 mg | Freq: Four times a day (QID) | RECTAL | Status: DC | PRN

## 2022-03-02 MED ORDER — MIDAZOLAM HCL 2 MG/2ML IJ SOLN: 2.0000 mg | INTRAMUSCULAR | Status: DC | PRN

## 2022-03-02 MED ORDER — FENTANYL CITRATE PF 50 MCG/ML IJ SOSY: 25.0000 ug | PREFILLED_SYRINGE | INTRAMUSCULAR | Status: DC | PRN

## 2022-03-02 MED ORDER — ACETAMINOPHEN 650 MG RE SUPP: 650.0000 mg | RECTAL | Status: DC | PRN

## 2022-03-02 MED ORDER — SODIUM CHLORIDE 0.9 % IV BOLUS
500.0000 mL | Freq: Once | INTRAVENOUS | Status: AC
  Administered 2022-03-02: 500 mL via INTRAVENOUS

## 2022-03-02 MED ORDER — DEXTROSE 5 % IV SOLN: INTRAVENOUS | Status: DC

## 2022-03-03 LAB — BLOOD CULTURE ID PANEL (REFLEXED) - BCID2
A.calcoaceticus-baumannii: NOT DETECTED
Bacteroides fragilis: NOT DETECTED
Candida albicans: NOT DETECTED
Candida auris: NOT DETECTED
Candida glabrata: NOT DETECTED
Candida krusei: NOT DETECTED
Candida parapsilosis: NOT DETECTED
Candida tropicalis: NOT DETECTED
Cryptococcus neoformans/gattii: NOT DETECTED
Enterobacter cloacae complex: NOT DETECTED
Enterobacterales: NOT DETECTED
Enterococcus Faecium: NOT DETECTED
Enterococcus faecalis: NOT DETECTED
Escherichia coli: NOT DETECTED
Haemophilus influenzae: NOT DETECTED
Klebsiella aerogenes: NOT DETECTED
Klebsiella oxytoca: NOT DETECTED
Klebsiella pneumoniae: NOT DETECTED
Listeria monocytogenes: NOT DETECTED
Methicillin resistance mecA/C: NOT DETECTED
Neisseria meningitidis: NOT DETECTED
Proteus species: NOT DETECTED
Pseudomonas aeruginosa: NOT DETECTED
Salmonella species: NOT DETECTED
Serratia marcescens: NOT DETECTED
Staphylococcus aureus (BCID): NOT DETECTED
Staphylococcus epidermidis: DETECTED — AB
Staphylococcus lugdunensis: NOT DETECTED
Staphylococcus species: DETECTED — AB
Stenotrophomonas maltophilia: NOT DETECTED
Streptococcus agalactiae: NOT DETECTED
Streptococcus pneumoniae: NOT DETECTED
Streptococcus pyogenes: NOT DETECTED
Streptococcus species: DETECTED — AB

## 2022-03-05 LAB — CULTURE, BLOOD (ROUTINE X 2)

## 2022-03-08 LAB — CULTURE, BLOOD (ROUTINE X 2): Culture: NO GROWTH

## 2022-03-19 NOTE — H&P (Signed)
NAME:  [name], MRN:  [id number], DOB:  [birth year], LOS: 0 ADMISSION DATE:  07/02/2022, CONSULTATION DATE: 03/02/2021 REFERRING MD: Emergency department physician, CHIEF COMPLAINT: Postarrest  History of Present Illness:  81 year old male who was in his normal state of health walk around to the backyard and was found down from unknown reason required 11 minutes of CPR King airway [facility].  He does have a history posterior communicating artery and has been followed by Dr. Pearlean BrownieSethi emetine 2019.  His exam mimics acute neurological injury CT of the head did not show anything but did reveal a C2 fracture.  He was placed in c-collar neurosurgery was called by emergency department neurology was called by the pulmonary critical care team.  He will be admitted to the intensive care unit for further evaluation and treatment.  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    Vent Mode: PRVC FiO2 (%):  [100 %] 100 % Set Rate:  [18 bmp] 18 bmp Vt Set:  [630 mL] 630 mL PEEP:  [5 cmH20] 5 cmH20 Plateau Pressure:  [19 cmH20] 19 cmH20  No intake or output data in the 24 hours ending 06/16/22 1051 Filed Weights   06/16/22 1000  Weight: 81.2 kg    Examination: General: Elderly male who is obtunded HENT: Facial trauma was noted from fall Lungs: Clear to auscultation  bilaterally Cardiovascular: Heart sounds regular Abdomen: Soft nontender Extremities: Without edema Neuro: Negative doll's eyes.  No gag reflex.  No response to noxious stimuli whatsoever.     Resolved Hospital Problem list     Assessment & Plan:  Ventilator dependent respiratory failure status post arrest questionable cardiac versus neurological focus. Full mechanical ventricular support Admit to intensive care unit CT of the head May need MRI with a history of aneurysm Consider neurology consult Check troponins for completeness   Obtunded after arrest with poor prognostic sign. C2 cervical fx  Neurological consult Consider MRI Stop sedation I suspect he will decompensate quickly. Orders for peripheral Levophed will be placed   Facial trauma secondary to fall Place c-collar Await reading of CT of neck  Diabetes Sign scale insulin protocol          Best Practice (right click and "Reselect all SmartList Selections" daily)   Diet/type: NPO DVT prophylaxis: not indicated GI prophylaxis: PPI Lines: N/A Foley:  N/A Code Status:  full code Last date of multidisciplinary goals of care discussion tbd  Labs   CBC: Recent Labs  Lab 06/16/22 1041  HGB 14.6  HCT 43.0    Basic Metabolic Panel: Recent Labs  Lab 06/16/22 1041  NA 138  K 3.9  CL 101  GLUCOSE 291*  BUN 18  CREATININE 1.20   GFR: Estimated Creatinine Clearance: 56.4 mL/min (by C-G formula based on SCr of 1.2 mg/dL).  No results for input(s): "PROCALCITON", "WBC", "LATICACIDVEN" in the last 168 hours.  Liver Function Tests: No results for input(s): "AST", "ALT", "ALKPHOS", "BILITOT", "PROT", "ALBUMIN" in the last 168 hours. No results for input(s): "LIPASE", "AMYLASE" in the last 168 hours. No results for input(s): "AMMONIA" in the last 168 hours.  ABG    Component Value Date/Time   TCO2 25 March 10, 2022 1041     Coagulation Profile: No results for input(s): "INR", "PROTIME" in  the last 168 hours.  Cardiac Enzymes: No results for input(s): "CKTOTAL", "CKMB", "CKMBINDEX", "TROPONINI" in the last 168 hours.  HbA1C: Hgb A1c MFr Bld  Date/Time Value Ref Range Status  05/22/2018 11:05 AM 7.3 (H) 4.8 - 5.6 % Final    Comment:    (NOTE) Pre diabetes:          5.7%-6.4% Diabetes:              >6.4% Glycemic control for   <7.0% adults with diabetes   12/28/2016 04:14 AM 6.5 (H) 4.8 - 5.6 % Final    Comment:    (NOTE)         Pre-diabetes: 5.7 - 6.4         Diabetes: >6.4         Glycemic control for adults with diabetes: <7.0     CBG: No results for input(s): "GLUCAP" in the last 168 hours.  Review of Systems:   na  Past Medical History:  He,  has a past medical history of Arthritis, Diabetes mellitus without complication (HCC), GERD (gastroesophageal reflux disease) (12/26/2016), History of kidney stones, Hypertension, and Stroke (HCC).   Surgical History:   Past Surgical History:  Procedure Laterality Date   BACK SURGERY     COLONOSCOPY WITH PROPOFOL N/A 11/18/2014   Procedure: COLONOSCOPY WITH PROPOFOL;  Surgeon: Charolett Bumpers, MD;  Location: [facility] ENDOSCOPY;  Service: Endoscopy;  Laterality: N/A;   LITHOTRIPSY     ROTATOR CUFF REPAIR     bilateral shoulder   THROAT SURGERY     benign tumor removed     Social History:   reports that he has never smoked. He has never used smokeless tobacco. He reports that he does not drink alcohol and does not use drugs.   Family History:  His family history includes Dementia in his mother; Heart failure in his mother.   Allergies No Known Allergies   Home Medications  Prior to Admission medications   Medication Sig Start Date End Date Taking? Authorizing Provider  ALPRAZolam Prudy Feeler) 0.5 MG tablet Take 0.25 mg by mouth at bedtime. 09/28/16   [provider]  amLODipine (NORVASC) 10 MG tablet Take 10 mg by mouth daily.     [provider]  clopidogrel (PLAVIX) 75 MG tablet TAKE 1 TABLET BY  MOUTH EVERY DAY 01/27/21   Micki Riley, MD  ezetimibe (ZETIA) 10 MG tablet Take 10 mg by mouth daily. 12/01/19   [provider]  gabapentin (NEURONTIN) 100 MG capsule SMARTSIG:2 Capsule(s) By Mouth Every Evening 01/27/21   [provider]  glimepiride (AMARYL) 1 MG tablet Take 1 mg by mouth daily.     [provider]  HYDROcodone-acetaminophen (NORCO/VICODIN) 5-325 MG tablet Take 1-2 tablets by mouth every 4 (four) hours as needed (pain). 05/29/18   Shirlean Kelly, MD  LORazepam (ATIVAN) 1 MG tablet TAKE 1/2 TABLET AT BEDTIME AND ONE LATER IN EARLY A.M. AS NEEDED ONCE A DAY ORALLY 90 DAYS 11/30/19   [provider]  losartan-hydrochlorothiazide (HYZAAR) 50-12.5 MG tablet Take 1 tablet by mouth daily. 10/09/15   [provider]  meclizine (ANTIVERT) 25 MG tablet Take 1 tablet (25 mg total) by mouth 3 (three) times daily as needed for dizziness. 12/28/16   Maxie Barb, MD  meloxicam (MOBIC) 15 MG tablet Take 15 mg by mouth daily. 11/06/20   [provider]  metFORMIN (GLUCOPHAGE) 500 MG tablet Take 500 mg by mouth 2 (two) times daily with a meal.    [provider]  Multiple Vitamin (MULTIVITAMIN WITH MINERALS) TABS Take 1 tablet by mouth daily.    [provider]  pioglitazone (ACTOS) 15 MG tablet Take 15 mg by mouth daily.    [provider]  ranitidine (ZANTAC) 150 MG tablet Take 150 mg by mouth 2 (two) times daily.    [provider]     Critical care time: 23 min    Brett Canales Anahita Cua ACNP Acute Care Nurse Practitioner [facility]/Critical [facility] Please consult Amion 07-Mar-2022, 11:35 AM

## 2022-03-19 NOTE — Progress Notes (Signed)
[facility] PCCM  Called to bedside for periods of bradycardia   On exam: BP 130's over 90's HR 70's but will have periods in 30s Lungs clear Belly soft Some eye opening and rhythmic facial twitching symmetrical when I say his name No movement of arms/legs  Tele strip: 2nd degree AV block, PR interval looks fixed  Impression Anoxic encephalopathy Bradycardia> picture worrisome for mobitz II Acute respiratory failure with hypoxemia  Plan: Cardiology consult Change from levophed to dopamine Zoll to bedside Atropine prn HF < 40 for now Stat ABG  Additional cc time 20 minutes  Heber , MD  PCCM Pager: [phone] Cell: [phone] After 7:00 pm call Elink  [phone]

## 2022-03-19 NOTE — Progress Notes (Signed)
Echocardiogram 2D Echocardiogram has been performed.  Warren Lacy Neymar Dowe RDCS Mar 15, 2022, 1:46 PM

## 2022-03-19 NOTE — IPAL (Signed)
  Interdisciplinary Goals of Care Family Meeting   Date carried out: March 20, 2022  Location of the meeting: Conference room  Member's involved: Physician and Family Member or next of kin  [place] or acting medical decision maker: Wife [name] with son [name] and daughter [name]    Discussion: We discussed goals of care for [name] .  We discussed Dr. Arnoldo Morale' findings from the CT scan of his cervical spine showing high grade stenosis, C2 dens fracture and C1 fracture and high likelihood of permanent quadriplegia.  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  I explained that we could support him for the next 72 hours to see if his mental status improved so he could engage in the conversation.  However they feel that he would not want to wake up to learn that he is quadriplegic.  They would like to withdraw care rather than continue ongoing life support.    Code status: Full DNR  Disposition: In-patient comfort care : will transition to full comfort measures after family has visited him  Time spent for the meeting: 45 minutes    Roselie Awkward, MD  March 20, 2022, 3:23 PM

## 2022-03-19 NOTE — Progress Notes (Signed)
RT note-Orders received for compassionate extubation. Patient is very comfortable, family is at the bedside.

## 2022-03-19 NOTE — Consult Note (Signed)
Reason for Consult: C1 and C2 fracture, quadriplegia Referring Physician: ER Dr.  [name] is an 81 y.o. male.  HPI: The patient is a 81 year old white male who by report was found unconscious in his garden today.  CPR was started.  The patient was brought to [facility] and intubated.  He was worked up with a head CT which was unremarkable and a cervical CT which demonstrated a C1 and C2 fracture and possible epidural hematoma versus pannus.  The patient was noted to be unresponsive and quadriplegic.  A neurosurgical consultation was requested.  Presently the patient is intubated.  His paralytics were given hours ago.  His propofol was stopped about an hour ago.  He is unresponsive and does not move his lower extremities.  His family is at the bedside.    Past Medical History:  Diagnosis Date   Arthritis    Diabetes mellitus without complication (HCC)    GERD (gastroesophageal reflux disease) 12/26/2016   History of kidney stones    Lithotripsy   Hypertension    Stroke [facility])    Light Stroke during episode of vertigo - April 2018    Past Surgical History:  Procedure Laterality Date   BACK SURGERY     COLONOSCOPY WITH PROPOFOL N/A 11/18/2014   Procedure: COLONOSCOPY WITH PROPOFOL;  Surgeon: Charolett Bumpers, MD;  Location: [facility];  Service: Endoscopy;  Laterality: N/A;   LITHOTRIPSY     ROTATOR CUFF REPAIR     bilateral shoulder   THROAT SURGERY     benign tumor removed    Family History  Problem Relation Age of Onset   Heart failure Mother    Dementia Mother     Social History:  reports that he has never smoked. He has never used smokeless tobacco. He reports that he does not drink alcohol and does not use drugs.  Allergies:  Allergies  Allergen Reactions   Biaxin [Clarithromycin] Other (See Comments)    Mouth ulcers   Codeine Other (See Comments)    Mouth ulcers   Levaquin [Levofloxacin] Other (See Comments)    Leg pain   Pravachol  [Pravastatin] Other (See Comments)    Myalgias     Medications: I have reviewed the patient's current medications. Prior to Admission: (Not in a hospital admission)  Scheduled:  docusate  100 mg Per Tube BID   heparin  5,000 Units Subcutaneous Q8H   insulin aspart  0-15 Units Subcutaneous Q4H   pantoprazole (PROTONIX) IV  40 mg Intravenous QHS   polyethylene glycol  17 g Per Tube Daily   Continuous:  sodium chloride Stopped (03-30-2022 1221)   norepinephrine (LEVOPHED) Adult infusion 2 mcg/min (03-30-2022 1300)   propofol (DIPRIVAN) infusion Stopped (2022/03/30 1130)   PRN:[START ON 03/04/2022] acetaminophen **OR** [START ON 03/04/2022] acetaminophen (TYLENOL) oral liquid 160 mg/5 mL **OR** [START ON 03/04/2022] acetaminophen, etomidate, fentaNYL (SUBLIMAZE) injection, fentaNYL (SUBLIMAZE) injection, ondansetron (ZOFRAN) IV, rocuronium Anti-infectives (From admission, onward)    None        Results for orders placed or performed during the hospital encounter of 30-Mar-2022 (from the past 48 hour(s))  CBC with Differential     Status: Abnormal   Collection Time: 03/30/22 10:34 AM  Result Value Ref Range   WBC 10.4 4.0 - 10.5 K/uL   RBC 4.74 4.22 - 5.81 MIL/uL   Hemoglobin 14.7 13.0 - 17.0 g/dL   HCT 32.9 51.8 - 84.1 %   MCV 94.3 80.0 - 100.0 fL  MCH 31.0 26.0 - 34.0 pg   MCHC 32.9 30.0 - 36.0 g/dL   RDW 16.1 09.6 - 04.5 %   Platelets 209 150 - 400 K/uL   nRBC 0.0 0.0 - 0.2 %   Neutrophils Relative % 58 %   Neutro Abs 6.0 1.7 - 7.7 K/uL   Lymphocytes Relative 27 %   Lymphs Abs 2.8 0.7 - 4.0 K/uL   Monocytes Relative 11 %   Monocytes Absolute 1.1 (H) 0.1 - 1.0 K/uL   Eosinophils Relative 2 %   Eosinophils Absolute 0.2 0.0 - 0.5 K/uL   Basophils Relative 0 %   Basophils Absolute 0.0 0.0 - 0.1 K/uL   Immature Granulocytes 2 %   Abs Immature Granulocytes 0.23 (H) 0.00 - 0.07 K/uL    Comment: Performed at [facility], [address] [place], [place] [address]   Comprehensive metabolic panel     Status: Abnormal   Collection Time: March 18, 2022 10:34 AM  Result Value Ref Range   Sodium 138 135 - 145 mmol/L   Potassium 3.9 3.5 - 5.1 mmol/L   Chloride 102 98 - 111 mmol/L   CO2 23 22 - 32 mmol/L   Glucose, Bld 291 (H) 70 - 99 mg/dL    Comment: Glucose reference range applies only to samples taken after fasting for at least 8 hours.   BUN 17 8 - 23 mg/dL   Creatinine, Ser 1.91 (H) 0.61 - 1.24 mg/dL   Calcium 8.8 (L) 8.9 - 10.3 mg/dL   Total Protein 6.5 6.5 - 8.1 g/dL   Albumin 3.9 3.5 - 5.0 g/dL   AST 70 (H) 15 - 41 U/L   ALT 62 (H) 0 - 44 U/L   Alkaline Phosphatase 72 38 - 126 U/L   Total Bilirubin 0.6 0.3 - 1.2 mg/dL   GFR, Estimated 48 (L) >60 mL/min    Comment: (NOTE) Calculated using the CKD-EPI Creatinine Equation (2021)    Anion gap 13 5 - 15    Comment: Performed at [facility], [address], [place], [place] [address]  Magnesium     Status: None   Collection Time: 03-18-2022 10:34 AM  Result Value Ref Range   Magnesium 2.0 1.7 - 2.4 mg/dL    Comment: Performed at [facility], [address] [place] [address] [place], [place] [address]  Troponin I (High Sensitivity)     Status: None   Collection Time: 2022-03-18 10:34 AM  Result Value Ref Range   Troponin I (High Sensitivity) 17 <18 ng/L    Comment: (NOTE) Elevated high sensitivity troponin I (hsTnI) values and significant  changes across serial measurements may suggest ACS but many other  chronic and acute conditions are known to elevate hsTnI results.  Refer to the "Links" section for chest pain algorithms and additional  guidance. Performed at [facility], [address], [place], [place] [address]   Lactic acid, plasma     Status: Abnormal   Collection Time: 2022-03-18 10:34 AM  Result Value Ref Range   Lactic Acid, Venous 6.9 (HH) 0.5 - 1.9 mmol/L    Comment: CRITICAL RESULT CALLED TO, READ BACK BY AND VERIFIED WITH: Jarrett Ables, RN [id number] 2022-03-18 L. KLAR Performed at [facility], [address] [place], [place] [address]   Ethanol     Status: None   Collection Time: 2022-03-18 10:34 AM  Result Value Ref Range   Alcohol, Ethyl (B) <10 <10 mg/dL  Comment: (NOTE) Lowest detectable limit for serum alcohol is 10 mg/dL.  For medical purposes only. Performed at [facility], [address] [place], [place] [id number]   I-stat chem 8, ED (not at [facility] or [facility])     Status: Abnormal   Collection Time: 09/09/2022 10:41 AM  Result Value Ref Range   Sodium 138 135 - 145 mmol/L   Potassium 3.9 3.5 - 5.1 mmol/L   Chloride 101 98 - 111 mmol/L   BUN 18 8 - 23 mg/dL   Creatinine, Ser 1.301.20 0.61 - 1.24 mg/dL   Glucose, Bld 865291 (H) 70 - 99 mg/dL    Comment: Glucose reference range applies only to samples taken after fasting for at least 8 hours.   Calcium, Ion 1.14 (L) 1.15 - 1.40 mmol/L   TCO2 25 22 - 32 mmol/L   Hemoglobin 14.6 13.0 - 17.0 g/dL   HCT 78.443.0 69.639.0 - 29.552.0 %  I-Stat arterial blood gas, ED [facility] ED only)     Status: Abnormal   Collection Time: 09/09/2022 11:27 AM  Result Value Ref Range   pH, Arterial 7.353 7.35 - 7.45   pCO2 arterial 41.4 32 - 48 mmHg   pO2, Arterial 325 (H) 83 - 108 mmHg   Bicarbonate 23.3 20.0 - 28.0 mmol/L   TCO2 25 22 - 32 mmol/L   O2 Saturation 100 %   Acid-base deficit 3.0 (H) 0.0 - 2.0 mmol/L   Sodium 138 135 - 145 mmol/L   Potassium 3.6 3.5 - 5.1 mmol/L   Calcium, Ion 1.16 1.15 - 1.40 mmol/L   HCT 36.0 (L) 39.0 - 52.0 %   Hemoglobin 12.2 (L) 13.0 - 17.0 g/dL   Patient temperature 28.496.5 F    Collection site RADIAL, ALLEN'S TEST ACCEPTABLE    Drawn by RT    Sample type ARTERIAL   CBG monitoring, ED     Status: Abnormal   Collection Time: 09/09/2022 12:48 PM  Result Value Ref Range   Glucose-Capillary 224 (H) 70 - 99 mg/dL    Comment: Glucose reference range applies only to samples taken after fasting for at least 8 hours.   Comment 1 Notify RN    Comment 2 Document in Chart     CT Head Wo Contrast  Result Date:  Feb 27, 2022 CLINICAL DATA:  Head/neck trauma, cardiac arrest. EXAM: CT HEAD WITHOUT CONTRAST CT MAXILLOFACIAL WITHOUT CONTRAST CT CERVICAL SPINE WITHOUT CONTRAST TECHNIQUE: Multidetector CT imaging of the head, cervical spine, and maxillofacial structures were performed using the standard protocol without intravenous contrast. Multiplanar CT image reconstructions of the cervical spine and maxillofacial structures were also generated. RADIATION DOSE REDUCTION: This exam was performed according to the departmental dose-optimization program which includes automated exposure control, adjustment of the mA and/or kV according to patient size and/or use of iterative reconstruction technique. COMPARISON:  CT head 12/14/2017 and CT neck 12/26/2016 FINDINGS: CT HEAD FINDINGS Brain: Periventricular white matter and corona radiata hypodensities favor chronic ischemic microvascular white matter disease. Otherwise, the brainstem, cerebellum, cerebral peduncles, thalamus, basal ganglia, basilar cisterns, and ventricular system appear within normal limits. No intracranial hemorrhage, mass lesion, or acute CVA. Vascular: There is atherosclerotic calcification of the cavernous carotid arteries bilaterally. Skull: Upper cervical spine fractures noted, please refer to cervical spine sections of this report. No acute calvarial fracture is identified. Other: The patient is orally intubated. CT MAXILLOFACIAL FINDINGS Osseous: Upper cervical spine fracture noted, see report below. No facial fracture is observed. Orbits: Unremarkable Sinuses: There is  chronic ethmoid, left frontal, right sphenoid, and left maxillary sinusitis. Soft tissues: Mild soft tissue swelling along the right forehead. CT CERVICAL SPINE FINDINGS Alignment: No substantial intervertebral malalignment aside from the subluxation of the unstable odontoid fracture. Skull base and vertebrae: C1: Segmental fracture of the ring of C1 with transverse involvement of the right  anterior lamina on image 24 series 10; and segmental involvement of the posterior lamina spinous process region as shown on image 25 series 10. On coronal images 30-31 of series 9, vertical malalignment of the left lamina with the spinous process and right lamina is observed with about 6 mm of inferior displacement of the left lamina. C2: There is an unstable type 2 odontoid fracture with 6 mm of posterior displacement of the odontoid fragment with respect to the body of C2. There is high density material potentially reflecting pannus and/or blood products posterior to the odontoid, with only about 5-6 mm of anterior posterior space for the cord in this vicinity. No other definite cervical spine fractures. Discontinuities in the anterior osteophytes at C2-3, C3-4, and C6-7 appear to likely be chronic when compared to 12/26/2016. Fused facet joint on the right at C2-3. Soft tissues and spinal canal: As noted above, there is substantial narrowing of the space for the upper cervical cord/medulla at the C1-2 level due to anterior epidural pannus/hematoma and the unstable odontoid fracture. Disc levels: Spurring causes right foraminal impingement at C2-3 and C3-4, and left foraminal impingement C3-4 and C4-5. Multilevel mild to moderate central narrowing of the thecal sac in the upper cervical spine due to spurring. Upper chest: Unremarkable Other: No supplemental non-categorized findings. IMPRESSION: 1. Unstable type 2 odontoid fracture with up to 6 mm posterior displacement. Retro odontoid hematoma and/or pannus narrows the AP diameter of the thecal sac to 5- 6 mm in this vicinity. 2. Fractures involving the posterior elements of C1, with a segmental fracture of the right lamina with anterior and posterior components, and a fracture of the left lamina near the spinous process, resulting in a separate spinous process component. 3. No other cervical spine fractures are identified although there is multilevel impingement  due to cervical spondylosis. 4. No acute intracranial findings. 5. No acute facial fractures. 6. Chronic paranasal sinusitis. Mild soft tissue swelling along the right forehead. Critical Value/emergent results were called by telephone at the time of interpretation on 2022-03-20 at 11:37 am to provider [facility] , who verbally acknowledged these results. Electronically Signed   By: Gaylyn Rong M.D.   On: March 20, 2022 11:56   CT Cervical Spine Wo Contrast  Result Date: 2022-03-20 CLINICAL DATA:  Head/neck trauma, cardiac arrest. EXAM: CT HEAD WITHOUT CONTRAST CT MAXILLOFACIAL WITHOUT CONTRAST CT CERVICAL SPINE WITHOUT CONTRAST TECHNIQUE: Multidetector CT imaging of the head, cervical spine, and maxillofacial structures were performed using the standard protocol without intravenous contrast. Multiplanar CT image reconstructions of the cervical spine and maxillofacial structures were also generated. RADIATION DOSE REDUCTION: This exam was performed according to the departmental dose-optimization program which includes automated exposure control, adjustment of the mA and/or kV according to patient size and/or use of iterative reconstruction technique. COMPARISON:  CT head 12/14/2017 and CT neck 12/26/2016 FINDINGS: CT HEAD FINDINGS Brain: Periventricular white matter and corona radiata hypodensities favor chronic ischemic microvascular white matter disease. Otherwise, the brainstem, cerebellum, cerebral peduncles, thalamus, basal ganglia, basilar cisterns, and ventricular system appear within normal limits. No intracranial hemorrhage, mass lesion, or acute CVA. Vascular: There is atherosclerotic calcification of the cavernous carotid arteries  bilaterally. Skull: Upper cervical spine fractures noted, please refer to cervical spine sections of this report. No acute calvarial fracture is identified. Other: The patient is orally intubated. CT MAXILLOFACIAL FINDINGS Osseous: Upper cervical spine fracture noted,  see report below. No facial fracture is observed. Orbits: Unremarkable Sinuses: There is chronic ethmoid, left frontal, right sphenoid, and left maxillary sinusitis. Soft tissues: Mild soft tissue swelling along the right forehead. CT CERVICAL SPINE FINDINGS Alignment: No substantial intervertebral malalignment aside from the subluxation of the unstable odontoid fracture. Skull base and vertebrae: C1: Segmental fracture of the ring of C1 with transverse involvement of the right anterior lamina on image 24 series 10; and segmental involvement of the posterior lamina spinous process region as shown on image 25 series 10. On coronal images 30-31 of series 9, vertical malalignment of the left lamina with the spinous process and right lamina is observed with about 6 mm of inferior displacement of the left lamina. C2: There is an unstable type 2 odontoid fracture with 6 mm of posterior displacement of the odontoid fragment with respect to the body of C2. There is high density material potentially reflecting pannus and/or blood products posterior to the odontoid, with only about 5-6 mm of anterior posterior space for the cord in this vicinity. No other definite cervical spine fractures. Discontinuities in the anterior osteophytes at C2-3, C3-4, and C6-7 appear to likely be chronic when compared to 12/26/2016. Fused facet joint on the right at C2-3. Soft tissues and spinal canal: As noted above, there is substantial narrowing of the space for the upper cervical cord/medulla at the C1-2 level due to anterior epidural pannus/hematoma and the unstable odontoid fracture. Disc levels: Spurring causes right foraminal impingement at C2-3 and C3-4, and left foraminal impingement C3-4 and C4-5. Multilevel mild to moderate central narrowing of the thecal sac in the upper cervical spine due to spurring. Upper chest: Unremarkable Other: No supplemental non-categorized findings. IMPRESSION: 1. Unstable type 2 odontoid fracture with up  to 6 mm posterior displacement. Retro odontoid hematoma and/or pannus narrows the AP diameter of the thecal sac to 5- 6 mm in this vicinity. 2. Fractures involving the posterior elements of C1, with a segmental fracture of the right lamina with anterior and posterior components, and a fracture of the left lamina near the spinous process, resulting in a separate spinous process component. 3. No other cervical spine fractures are identified although there is multilevel impingement due to cervical spondylosis. 4. No acute intracranial findings. 5. No acute facial fractures. 6. Chronic paranasal sinusitis. Mild soft tissue swelling along the right forehead. Critical Value/emergent results were called by telephone at the time of interpretation on 03-11-22 at 11:37 am to provider [facility] , who verbally acknowledged these results. Electronically Signed   By: Gaylyn Rong M.D.   On: 03-11-22 11:56   CT Maxillofacial Wo Contrast  Result Date: 2022/03/11 CLINICAL DATA:  Head/neck trauma, cardiac arrest. EXAM: CT HEAD WITHOUT CONTRAST CT MAXILLOFACIAL WITHOUT CONTRAST CT CERVICAL SPINE WITHOUT CONTRAST TECHNIQUE: Multidetector CT imaging of the head, cervical spine, and maxillofacial structures were performed using the standard protocol without intravenous contrast. Multiplanar CT image reconstructions of the cervical spine and maxillofacial structures were also generated. RADIATION DOSE REDUCTION: This exam was performed according to the departmental dose-optimization program which includes automated exposure control, adjustment of the mA and/or kV according to patient size and/or use of iterative reconstruction technique. COMPARISON:  CT head 12/14/2017 and CT neck 12/26/2016 FINDINGS: CT HEAD FINDINGS Brain: Periventricular  white matter and corona radiata hypodensities favor chronic ischemic microvascular white matter disease. Otherwise, the brainstem, cerebellum, cerebral peduncles, thalamus, basal  ganglia, basilar cisterns, and ventricular system appear within normal limits. No intracranial hemorrhage, mass lesion, or acute CVA. Vascular: There is atherosclerotic calcification of the cavernous carotid arteries bilaterally. Skull: Upper cervical spine fractures noted, please refer to cervical spine sections of this report. No acute calvarial fracture is identified. Other: The patient is orally intubated. CT MAXILLOFACIAL FINDINGS Osseous: Upper cervical spine fracture noted, see report below. No facial fracture is observed. Orbits: Unremarkable Sinuses: There is chronic ethmoid, left frontal, right sphenoid, and left maxillary sinusitis. Soft tissues: Mild soft tissue swelling along the right forehead. CT CERVICAL SPINE FINDINGS Alignment: No substantial intervertebral malalignment aside from the subluxation of the unstable odontoid fracture. Skull base and vertebrae: C1: Segmental fracture of the ring of C1 with transverse involvement of the right anterior lamina on image 24 series 10; and segmental involvement of the posterior lamina spinous process region as shown on image 25 series 10. On coronal images 30-31 of series 9, vertical malalignment of the left lamina with the spinous process and right lamina is observed with about 6 mm of inferior displacement of the left lamina. C2: There is an unstable type 2 odontoid fracture with 6 mm of posterior displacement of the odontoid fragment with respect to the body of C2. There is high density material potentially reflecting pannus and/or blood products posterior to the odontoid, with only about 5-6 mm of anterior posterior space for the cord in this vicinity. No other definite cervical spine fractures. Discontinuities in the anterior osteophytes at C2-3, C3-4, and C6-7 appear to likely be chronic when compared to 12/26/2016. Fused facet joint on the right at C2-3. Soft tissues and spinal canal: As noted above, there is substantial narrowing of the space for the  upper cervical cord/medulla at the C1-2 level due to anterior epidural pannus/hematoma and the unstable odontoid fracture. Disc levels: Spurring causes right foraminal impingement at C2-3 and C3-4, and left foraminal impingement C3-4 and C4-5. Multilevel mild to moderate central narrowing of the thecal sac in the upper cervical spine due to spurring. Upper chest: Unremarkable Other: No supplemental non-categorized findings. IMPRESSION: 1. Unstable type 2 odontoid fracture with up to 6 mm posterior displacement. Retro odontoid hematoma and/or pannus narrows the AP diameter of the thecal sac to 5- 6 mm in this vicinity. 2. Fractures involving the posterior elements of C1, with a segmental fracture of the right lamina with anterior and posterior components, and a fracture of the left lamina near the spinous process, resulting in a separate spinous process component. 3. No other cervical spine fractures are identified although there is multilevel impingement due to cervical spondylosis. 4. No acute intracranial findings. 5. No acute facial fractures. 6. Chronic paranasal sinusitis. Mild soft tissue swelling along the right forehead. Critical Value/emergent results were called by telephone at the time of interpretation on 18-Mar-2022 at 11:37 am to provider [facility] , who verbally acknowledged these results. Electronically Signed   By: Gaylyn Rong M.D.   On: 03/18/22 11:56   DG Chest Portable 1 View  Result Date: 03-18-22 CLINICAL DATA:  81 year old male status post cardiac arrest and CPR. EXAM: PORTABLE CHEST 1 VIEW COMPARISON:  Chest radiographs 12/27/2016. FINDINGS: Portable AP supine view at 1035 hours. Pacer or resuscitation pads project over the left lower chest. Intubated. Endotracheal tube tip in good position between the clavicles and carina. Lower lung volumes. Mediastinal  contours appear stable and within normal limits. Allowing for portable technique the lungs are clear. Skin fold artifact  in the upper lungs suspected. No pneumothorax or pleural effusion identified on this supine view. Mild gaseous distension of the stomach. No acute osseous abnormality identified. IMPRESSION: 1. Endotracheal tube tip in good position. 2. Lower lung volumes with no acute cardiopulmonary abnormality identified. Electronically Signed   By: Odessa Fleming M.D.   On: Mar 23, 2022 10:49    ROS: Unobtainable Blood pressure 126/62, pulse 76, temperature (!) 96.4 F (35.8 C), temperature source Temporal, resp. rate 18, height  (1.854 m), weight 81.2 kg, SpO2 100 %. Estimated body mass index is 23.62 kg/m as calculated from the following:   Height as of this encounter:  (1.854 m).   Weight as of this encounter: 81.2 kg.  Physical Exam  General: An intubated unresponsive 81 year old white male in a cervical collar.  HEENT: The patient's pupils are small and equal bilaterally.  He has an abrasion on his forehead.  Neck: He is in a hard cervical collar.  Thorax: Symmetric  Abdomen: Soft  Extremities: Unremarkable  Neurologic exam: The patient is Glasgow Coma Scale 3 intubated.  The patient has a weak corneal reflex on the left.  He does not cough to tracheal suction.  He rarely spontaneously opens his eyes.  He does not follow commands with his cranial nerves.  He does not move his upper or lower extremities to painful stimuli or commands.  He does not have any reflexes in his bilateral quadricep and gastrocnemius.  I reviewed the patient's head CT performed today: It is unremarkable  I have also reviewed the patient's cervical CT.  He has a C2 dens fracture and a C1 refractures.  His dens is mildly posterior displaced.  He appears to have pannus versus epidural hematoma around the dens.  There is severe spinal stenosis.  He has diffuse degenerative changes/spondylosis.  Assessment/Plan: C1-2 fracture, quadriplegia: I have discussed this with the patient's family.  If he were to begin to wake  up/respond that we could work him up further with a cervical MRI.  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  Neurology is handling this.  I have discussed the situation with the patient's wife, son and daughter.  They understand he is gravely ill and will likely not survive all of this.  I have answered all of their questions.  Cristi Loron March 23, 2022, 1:34 PM

## 2022-03-19 NOTE — Progress Notes (Signed)
EEG complete - results pending 

## 2022-03-19 NOTE — ED Triage Notes (Incomplete)
Pt was in his backyard talkig to a neighbor, after leaving the conversation to tend to his backyard, pt collapsed. His neighbor noticed him fall face first. His neighbor Engineer, drilling) began BLS/CPR at 9:39am. [facility] arrived at 10:40am. Upon arrival pt. was asystole, they had to perform 10 minutes of CPR. ROSC at 9:50. Inital rhythm was Afib at a rate 180, tappered to 110 . 17mm tibial I/O placed on right leg. 20g in the left AC. 400 bolus of NS administered. Pt fell and hit his nose,small abrasions to the left elbow. Last V.S.: BP 132/80, HR 110 A-Fib, spO2: 30, CBG: 360

## 2022-03-19 NOTE — Procedures (Signed)
Patient Name: [name]  MRN: [id number]  Epilepsy Attending: Lora Havens  Referring Physician/Provider: Corey Harold, NP  Date:  Mar 21, 2022 Duration: 22.18 mins  Patient history: 81 year old patient with hypoxic ischemic brian injury after fall and cardiac arrest with 11 minutes of CPR before ROSC.  EEG to evaluate for seizure  Level of alertness: comatose  AEDs during EEG study: Propofol  Technical aspects: This EEG study was done with scalp electrodes positioned according to the 10-20 International system of electrode placement. Electrical activity was acquired at a sampling rate of 500Hz  and reviewed with a high frequency filter of 70Hz  and a low frequency filter of 1Hz . EEG data were recorded continuously and digitally stored.   Description: Patient was noted to have brief spontaneous eye opening every few seconds. 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  In between seizures, EEG showed near continuous background suppression.  Hyperventilation and photic stimulation were not performed.     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  Dr. Lynnae Sandhoff was notified.  [name]

## 2022-03-19 NOTE — Progress Notes (Signed)
This chaplain responded to MD referral for spiritual care in the ED.   The chaplain understands Pt. is post arrest. The Pt. wife and daughter are at the bedside and willing for the chaplain to share prayer for healing in the absence of their personal clergy. The family found comfort by the reassurance of a community of prayer and through storytelling. The chaplain understands the Pt. is an avid gardener who has already started sharing his tomatoes.  This chaplain offered F/U spiritual care as needed.  Chaplain Stephanie Acre [phone]

## 2022-03-19 NOTE — Consult Note (Signed)
Neurology Consultation  [name] MR# [id number] 12-Mar-2022    CC: Unresponsiveness  History is obtained from:Chart and family  HPI: [name] is a 81 y.o. male with a history of HTN, CKD2, DM, pcomm aneurysm, kidney stones, stroke and PVD who presents after being found down in his backyard in cardiac arrest.  Cause of arrest was unknown, and CPR was performed for 11 minutes before ROSC was achieved.  CT of the head shows an acute C2 fracture, and neurosurgery has been consulted.  Taylor has been unresponsive since his arrest with no response to commands or noxious stimuli.   ROS: Unable to obtain due to altered mental status.   Past Medical History:  Diagnosis Date   Arthritis    Diabetes mellitus without complication (HCC)    GERD (gastroesophageal reflux disease) 12/26/2016   History of kidney stones    Lithotripsy   Hypertension    Stroke [facility])    Light Stroke during episode of vertigo - April 2018     Family History  Problem Relation Age of Onset   Heart failure Mother    Dementia Mother      Social History:  reports that he has never smoked. He has never used smokeless tobacco. He reports that he does not drink alcohol and does not use drugs.   Prior to Admission medications   Medication Sig Start Date End Date Taking? Authorizing Provider  amLODipine (NORVASC) 10 MG tablet Take 10 mg by mouth daily.    Yes [provider]  clopidogrel (PLAVIX) 75 MG tablet TAKE 1 TABLET BY MOUTH EVERY DAY Taylor taking differently: Take 75 mg by mouth in the morning. 01/27/21  Yes Micki Riley, MD  ezetimibe (ZETIA) 10 MG tablet Take 10 mg by mouth daily. 12/01/19  Yes [provider]  gabapentin (NEURONTIN) 300 MG capsule Take 300 mg by mouth 2 (two) times daily. 02/06/22  Yes [provider]  glimepiride (AMARYL) 1 MG tablet Take 1 mg by mouth daily.    Yes [provider]  LORazepam (ATIVAN) 1 MG tablet Take 1 mg by  mouth at bedtime. 11/30/19  Yes [provider]  losartan-hydrochlorothiazide (HYZAAR) 50-12.5 MG tablet Take 1 tablet by mouth daily. 10/09/15  Yes [provider]  metFORMIN (GLUCOPHAGE) 500 MG tablet Take 500 mg by mouth 2 (two) times daily with a meal.   Yes [provider]  Multiple Vitamin (MULTIVITAMIN WITH MINERALS) TABS Take 1 tablet by mouth daily after breakfast.   Yes [provider]  pioglitazone (ACTOS) 15 MG tablet Take 15 mg by mouth daily.   Yes [provider]  TYLENOL 8 HOUR ARTHRITIS PAIN 650 MG CR tablet Take 650-1,330 mg by mouth See admin instructions. Take 1,300 mg by mouth at bedtime and an additional 650-1,330 mg once daily as needed for pain   Yes [provider]  ZANTAC 360 10 MG tablet Take 10 mg by mouth daily before breakfast.   Yes [provider]  HYDROcodone-acetaminophen (NORCO/VICODIN) 5-325 MG tablet Take 1-2 tablets by mouth every 4 (four) hours as needed (pain). Taylor not taking: Reported on Mar 12, 2022 05/29/18   Shirlean Kelly, MD  meclizine (ANTIVERT) 25 MG tablet Take 1 tablet (25 mg total) by mouth 3 (three) times daily as needed for dizziness. Taylor not taking: Reported on 03-12-22 12/28/16   Maxie Barb, MD     Exam: Current vital signs: BP 126/62   Pulse 76   Temp (!) 96.4  F (35.8 C) (Temporal)   Resp 18   Ht 6\' 1"  (1.854 m)   Wt 81.2 kg   SpO2 100%   BMI 23.62 kg/m    Physical Exam  (25 mcg fentanyl had been given 10 minutes prior to exam, but propofol had been turned off for 40 minutes.  Taylor received rocuronium and etomidate three hours prior to exam) Neuro: Mental Status: Taylor does not follow commands, does not respond to voice, touch or noxious stimuli.  Will open eyes and move liops spontaneously and nonpurposefully Cranial Nerves: II: Pupils 42mm and nonreactive, slight corneal reflex on left, none on right  III,IV, VI: Pupils fixed, do not follow  objects Motor: No movement to noxious stimuli in all four extremities Sensory: No response to sternal rub  Deep Tendon Reflexes: Patellar reflexes absent   I have reviewed labs in epic and the pertinent results are:  Lab Results  Component Value Date/Time   CHOL 148 12/28/2016 04:14 AM    Results for orders placed or performed during the hospital encounter of 2022-03-15 (from the past 48 hour(s))  CBC with Differential     Status: Abnormal   Collection Time: March 15, 2022 10:34 AM  Result Value Ref Range   WBC 10.4 4.0 - 10.5 K/uL   RBC 4.74 4.22 - 5.81 MIL/uL   Hemoglobin 14.7 13.0 - 17.0 g/dL   HCT 03/04/22 64.3 - 32.9 %   MCV 94.3 80.0 - 100.0 fL   MCH 31.0 26.0 - 34.0 pg   MCHC 32.9 30.0 - Gabriel.0 g/dL   RDW 51.8 84.1 - 66.0 %   Platelets 209 150 - 400 K/uL   nRBC 0.0 0.0 - 0.2 %   Neutrophils Relative % 58 %   Neutro Abs 6.0 1.7 - 7.7 K/uL   Lymphocytes Relative 27 %   Lymphs Abs 2.8 0.7 - 4.0 K/uL   Monocytes Relative 11 %   Monocytes Absolute 1.1 (H) 0.1 - 1.0 K/uL   Eosinophils Relative 2 %   Eosinophils Absolute 0.2 0.0 - 0.5 K/uL   Basophils Relative 0 %   Basophils Absolute 0.0 0.0 - 0.1 K/uL   Immature Granulocytes 2 %   Abs Immature Granulocytes 0.23 (H) 0.00 - 0.07 K/uL    Comment: Performed at [facility], [address], [place], [place]  Comprehensive metabolic panel     Status: Abnormal   Collection Time: 2022-03-15 10:34 AM  Result Value Ref Range   Sodium 138 135 - 145 mmol/L   Potassium 3.9 3.5 - 5.1 mmol/L   Chloride 102 98 - 111 mmol/L   CO2 23 22 - 32 mmol/L   Glucose, Bld 291 (H) 70 - 99 mg/dL    Comment: Glucose reference range applies only to samples taken after fasting for at least 8 hours.   BUN 17 8 - 23 mg/dL   Creatinine, Ser 03/04/22 (H) 0.61 - 1.24 mg/dL   Calcium 8.8 (L) 8.9 - 10.3 mg/dL   Total Protein 6.5 6.5 - 8.1 g/dL   Albumin 3.9 3.5 - 5.0 g/dL   AST 70 (H) 15 - 41 U/L   ALT 62 (H) 0 - 44 U/L   Alkaline Phosphatase 72 38  - 126 U/L   Total Bilirubin 0.6 0.3 - 1.2 mg/dL   GFR, Estimated 48 (L) >60 mL/min    Comment: (NOTE) Calculated using the CKD-EPI Creatinine Equation (2021)    Anion gap 13 5 - 15    Comment: Performed at  [facility], [address], [place], [place] [address]  Magnesium     Status: None   Collection Time: 2022/03/12 10:34 AM  Result Value Ref Range   Magnesium 2.0 1.7 - 2.4 mg/dL    Comment: Performed at [facility], [address] [place], [place] [address]  Troponin I (High Sensitivity)     Status: None   Collection Time: March 12, 2022 10:34 AM  Result Value Ref Range   Troponin I (High Sensitivity) 17 <18 ng/L    Comment: (NOTE) Elevated high sensitivity troponin I (hsTnI) values and significant  changes across serial measurements may suggest ACS but many other  chronic and acute conditions are known to elevate hsTnI results.  Refer to the "Links" section for chest pain algorithms and additional  guidance. Performed at [facility], [address] [place], [place] [address]   Lactic acid, plasma     Status: Abnormal   Collection Time: 2022-03-12 10:34 AM  Result Value Ref Range   Lactic Acid, Venous 6.9 (HH) 0.5 - 1.9 mmol/L    Comment: CRITICAL RESULT CALLED TO, READ BACK BY AND VERIFIED WITH: Jarrett Ables, RN [id number] 03-12-22 L. KLAR Performed at [facility] [place], [address], [place], [place] [address]   Ethanol     Status: None   Collection Time: 2022-03-12 10:34 AM  Result Value Ref Range   Alcohol, Ethyl (B) <10 <10 mg/dL    Comment: (NOTE) Lowest detectable limit for serum alcohol is 10 mg/dL.  For medical purposes only. Performed at [facility], [address] [place], [place] [address]   I-stat chem 8, ED (not at [facility] or [facility]     Status: Abnormal   Collection Time: 03-12-2022 10:41 AM  Result Value Ref Range   Sodium 138 135 - 145 mmol/L   Potassium 3.9 3.5 - 5.1 mmol/L   Chloride 101 98 - 111 mmol/L   BUN 18 8 - 23 mg/dL   Creatinine,  Ser 1.30 0.61 - 1.24 mg/dL   Glucose, Bld 865 (H) 70 - 99 mg/dL    Comment: Glucose reference range applies only to samples taken after fasting for at least 8 hours.   Calcium, Ion 1.14 (L) 1.15 - 1.40 mmol/L   TCO2 25 22 - 32 mmol/L   Hemoglobin 14.6 13.0 - 17.0 g/dL   HCT 78.4 69.6 - 29.5 %  I-Stat arterial blood gas, ED [facility] ED only)     Status: Abnormal   Collection Time: Mar 12, 2022 11:27 AM  Result Value Ref Range   pH, Arterial 7.353 7.35 - 7.45   pCO2 arterial 41.4 32 - 48 mmHg   pO2, Arterial 325 (H) 83 - 108 mmHg   Bicarbonate 23.3 20.0 - 28.0 mmol/L   TCO2 25 22 - 32 mmol/L   O2 Saturation 100 %   Acid-base deficit 3.0 (H) 0.0 - 2.0 mmol/L   Sodium 138 135 - 145 mmol/L   Potassium 3.6 3.5 - 5.1 mmol/L   Calcium, Ion 1.16 1.15 - 1.40 mmol/L   HCT Gabriel.0 (L) 39.0 - 52.0 %   Hemoglobin 12.2 (L) 13.0 - 17.0 g/dL   Taylor temperature 28.4 F    Collection site RADIAL, ALLEN'S TEST ACCEPTABLE    Drawn by RT    Sample type ARTERIAL   CBG monitoring, ED     Status: Abnormal   Collection Time: 03-12-22 12:48 PM  Result Value Ref Range   Glucose-Capillary 224 (H) 70 -  99 mg/dL    Comment: Glucose reference range applies only to samples taken after fasting for at least 8 hours.   Comment 1 Notify RN    Comment 2 Document in Chart     CT Head Wo Contrast  Result Date: March 22, 2022 CLINICAL DATA:  Head/neck trauma, cardiac arrest. EXAM: CT HEAD WITHOUT CONTRAST CT MAXILLOFACIAL WITHOUT CONTRAST CT CERVICAL SPINE WITHOUT CONTRAST TECHNIQUE: Multidetector CT imaging of the head, cervical spine, and maxillofacial structures were performed using the standard protocol without intravenous contrast. Multiplanar CT image reconstructions of the cervical spine and maxillofacial structures were also generated. RADIATION DOSE REDUCTION: This exam was performed according to the departmental dose-optimization program which includes automated exposure control, adjustment of the mA and/or kV according to  Taylor size and/or use of iterative reconstruction technique. COMPARISON:  CT head 12/14/2017 and CT neck 12/26/2016 FINDINGS: CT HEAD FINDINGS Brain: Periventricular white matter and corona radiata hypodensities favor chronic ischemic microvascular white matter disease. Otherwise, the brainstem, cerebellum, cerebral peduncles, thalamus, basal ganglia, basilar cisterns, and ventricular system appear within normal limits. No intracranial hemorrhage, mass lesion, or acute CVA. Vascular: There is atherosclerotic calcification of the cavernous carotid arteries bilaterally. Skull: Upper cervical spine fractures noted, please refer to cervical spine sections of this report. No acute calvarial fracture is identified. Other: The Taylor is orally intubated. CT MAXILLOFACIAL FINDINGS Osseous: Upper cervical spine fracture noted, see report below. No facial fracture is observed. Orbits: Unremarkable Sinuses: There is chronic ethmoid, left frontal, right sphenoid, and left maxillary sinusitis. Soft tissues: Mild soft tissue swelling along the right forehead. CT CERVICAL SPINE FINDINGS Alignment: No substantial intervertebral malalignment aside from the subluxation of the unstable odontoid fracture. Skull base and vertebrae: C1: Segmental fracture of the ring of C1 with transverse involvement of the right anterior lamina on image 24 series 10; and segmental involvement of the posterior lamina spinous process region as shown on image 25 series 10. On coronal images 30-31 of series 9, vertical malalignment of the left lamina with the spinous process and right lamina is observed with about 6 mm of inferior displacement of the left lamina. C2: There is an unstable type 2 odontoid fracture with 6 mm of posterior displacement of the odontoid fragment with respect to the body of C2. There is high density material potentially reflecting pannus and/or blood products posterior to the odontoid, with only about 5-6 mm of anterior  posterior space for the cord in this vicinity. No other definite cervical spine fractures. Discontinuities in the anterior osteophytes at C2-3, C3-4, and C6-7 appear to likely be chronic when compared to 12/26/2016. Fused facet joint on the right at C2-3. Soft tissues and spinal canal: As noted above, there is substantial narrowing of the space for the upper cervical cord/medulla at the C1-2 level due to anterior epidural pannus/hematoma and the unstable odontoid fracture. Disc levels: Spurring causes right foraminal impingement at C2-3 and C3-4, and left foraminal impingement C3-4 and C4-5. Multilevel mild to moderate central narrowing of the thecal sac in the upper cervical spine due to spurring. Upper chest: Unremarkable Other: No supplemental non-categorized findings. IMPRESSION: 1. Unstable type 2 odontoid fracture with up to 6 mm posterior displacement. Retro odontoid hematoma and/or pannus narrows the AP diameter of the thecal sac to 5- 6 mm in this vicinity. 2. Fractures involving the posterior elements of C1, with a segmental fracture of the right lamina with anterior and posterior components, and a fracture of the left lamina near the spinous process, resulting  in a separate spinous process component. 3. No other cervical spine fractures are identified although there is multilevel impingement due to cervical spondylosis. 4. No acute intracranial findings. 5. No acute facial fractures. 6. Chronic paranasal sinusitis. Mild soft tissue swelling along the right forehead. Critical Value/emergent results were called by telephone at the time of interpretation on 15-Mar-2022 at 11:37 am to provider [facility] , who verbally acknowledged these results. Electronically Signed   By: Gaylyn Rong M.D.   On: 03-15-22 11:56   CT Cervical Spine Wo Contrast  Result Date: Mar 15, 2022 CLINICAL DATA:  Head/neck trauma, cardiac arrest. EXAM: CT HEAD WITHOUT CONTRAST CT MAXILLOFACIAL WITHOUT CONTRAST CT CERVICAL  SPINE WITHOUT CONTRAST TECHNIQUE: Multidetector CT imaging of the head, cervical spine, and maxillofacial structures were performed using the standard protocol without intravenous contrast. Multiplanar CT image reconstructions of the cervical spine and maxillofacial structures were also generated. RADIATION DOSE REDUCTION: This exam was performed according to the departmental dose-optimization program which includes automated exposure control, adjustment of the mA and/or kV according to Taylor size and/or use of iterative reconstruction technique. COMPARISON:  CT head 12/14/2017 and CT neck 12/26/2016 FINDINGS: CT HEAD FINDINGS Brain: Periventricular white matter and corona radiata hypodensities favor chronic ischemic microvascular white matter disease. Otherwise, the brainstem, cerebellum, cerebral peduncles, thalamus, basal ganglia, basilar cisterns, and ventricular system appear within normal limits. No intracranial hemorrhage, mass lesion, or acute CVA. Vascular: There is atherosclerotic calcification of the cavernous carotid arteries bilaterally. Skull: Upper cervical spine fractures noted, please refer to cervical spine sections of this report. No acute calvarial fracture is identified. Other: The Taylor is orally intubated. CT MAXILLOFACIAL FINDINGS Osseous: Upper cervical spine fracture noted, see report below. No facial fracture is observed. Orbits: Unremarkable Sinuses: There is chronic ethmoid, left frontal, right sphenoid, and left maxillary sinusitis. Soft tissues: Mild soft tissue swelling along the right forehead. CT CERVICAL SPINE FINDINGS Alignment: No substantial intervertebral malalignment aside from the subluxation of the unstable odontoid fracture. Skull base and vertebrae: C1: Segmental fracture of the ring of C1 with transverse involvement of the right anterior lamina on image 24 series 10; and segmental involvement of the posterior lamina spinous process region as shown on image 25 series  10. On coronal images 30-31 of series 9, vertical malalignment of the left lamina with the spinous process and right lamina is observed with about 6 mm of inferior displacement of the left lamina. C2: There is an unstable type 2 odontoid fracture with 6 mm of posterior displacement of the odontoid fragment with respect to the body of C2. There is high density material potentially reflecting pannus and/or blood products posterior to the odontoid, with only about 5-6 mm of anterior posterior space for the cord in this vicinity. No other definite cervical spine fractures. Discontinuities in the anterior osteophytes at C2-3, C3-4, and C6-7 appear to likely be chronic when compared to 12/26/2016. Fused facet joint on the right at C2-3. Soft tissues and spinal canal: As noted above, there is substantial narrowing of the space for the upper cervical cord/medulla at the C1-2 level due to anterior epidural pannus/hematoma and the unstable odontoid fracture. Disc levels: Spurring causes right foraminal impingement at C2-3 and C3-4, and left foraminal impingement C3-4 and C4-5. Multilevel mild to moderate central narrowing of the thecal sac in the upper cervical spine due to spurring. Upper chest: Unremarkable Other: No supplemental non-categorized findings. IMPRESSION: 1. Unstable type 2 odontoid fracture with up to 6 mm posterior displacement. Retro odontoid hematoma  and/or pannus narrows the AP diameter of the thecal sac to 5- 6 mm in this vicinity. 2. Fractures involving the posterior elements of C1, with a segmental fracture of the right lamina with anterior and posterior components, and a fracture of the left lamina near the spinous process, resulting in a separate spinous process component. 3. No other cervical spine fractures are identified although there is multilevel impingement due to cervical spondylosis. 4. No acute intracranial findings. 5. No acute facial fractures. 6. Chronic paranasal sinusitis. Mild soft  tissue swelling along the right forehead. Critical Value/emergent results were called by telephone at the time of interpretation on 01/14/2022 at 11:37 am to [facility] , who verbally acknowledged these results. Electronically Signed   By: Gaylyn RongWalter  Liebkemann M.D.   On: 004/28/2023 11:56   CT Maxillofacial Wo Contrast  Result Date: 01/14/2022 CLINICAL DATA:  Head/neck trauma, cardiac arrest. EXAM: CT HEAD WITHOUT CONTRAST CT MAXILLOFACIAL WITHOUT CONTRAST CT CERVICAL SPINE WITHOUT CONTRAST TECHNIQUE: Multidetector CT imaging of the head, cervical spine, and maxillofacial structures were performed using the standard protocol without intravenous contrast. Multiplanar CT image reconstructions of the cervical spine and maxillofacial structures were also generated. RADIATION DOSE REDUCTION: This exam was performed according to the departmental dose-optimization program which includes automated exposure control, adjustment of the mA and/or kV according to Taylor size and/or use of iterative reconstruction technique. COMPARISON:  CT head 12/14/2017 and CT neck 12/26/2016 FINDINGS: CT HEAD FINDINGS Brain: Periventricular white matter and corona radiata hypodensities favor chronic ischemic microvascular white matter disease. Otherwise, the brainstem, cerebellum, cerebral peduncles, thalamus, basal ganglia, basilar cisterns, and ventricular system appear within normal limits. No intracranial hemorrhage, mass lesion, or acute CVA. Vascular: There is atherosclerotic calcification of the cavernous carotid arteries bilaterally. Skull: Upper cervical spine fractures noted, please refer to cervical spine sections of this report. No acute calvarial fracture is identified. Other: The Taylor is orally intubated. CT MAXILLOFACIAL FINDINGS Osseous: Upper cervical spine fracture noted, see report below. No facial fracture is observed. Orbits: Unremarkable Sinuses: There is chronic ethmoid, left frontal, right sphenoid,  and left maxillary sinusitis. Soft tissues: Mild soft tissue swelling along the right forehead. CT CERVICAL SPINE FINDINGS Alignment: No substantial intervertebral malalignment aside from the subluxation of the unstable odontoid fracture. Skull base and vertebrae: C1: Segmental fracture of the ring of C1 with transverse involvement of the right anterior lamina on image 24 series 10; and segmental involvement of the posterior lamina spinous process region as shown on image 25 series 10. On coronal images 30-31 of series 9, vertical malalignment of the left lamina with the spinous process and right lamina is observed with about 6 mm of inferior displacement of the left lamina. C2: There is an unstable type 2 odontoid fracture with 6 mm of posterior displacement of the odontoid fragment with respect to the body of C2. There is high density material potentially reflecting pannus and/or blood products posterior to the odontoid, with only about 5-6 mm of anterior posterior space for the cord in this vicinity. No other definite cervical spine fractures. Discontinuities in the anterior osteophytes at C2-3, C3-4, and C6-7 appear to likely be chronic when compared to 12/26/2016. Fused facet joint on the right at C2-3. Soft tissues and spinal canal: As noted above, there is substantial narrowing of the space for the upper cervical cord/medulla at the C1-2 level due to anterior epidural pannus/hematoma and the unstable odontoid fracture. Disc levels: Spurring causes right foraminal impingement at C2-3 and C3-4, and  left foraminal impingement C3-4 and C4-5. Multilevel mild to moderate central narrowing of the thecal sac in the upper cervical spine due to spurring. Upper chest: Unremarkable Other: No supplemental non-categorized findings. IMPRESSION: 1. Unstable type 2 odontoid fracture with up to 6 mm posterior displacement. Retro odontoid hematoma and/or pannus narrows the AP diameter of the thecal sac to 5- 6 mm in this  vicinity. 2. Fractures involving the posterior elements of C1, with a segmental fracture of the right lamina with anterior and posterior components, and a fracture of the left lamina near the spinous process, resulting in a separate spinous process component. 3. No other cervical spine fractures are identified although there is multilevel impingement due to cervical spondylosis. 4. No acute intracranial findings. 5. No acute facial fractures. 6. Chronic paranasal sinusitis. Mild soft tissue swelling along the right forehead. Critical Value/emergent results were called by telephone at the time of interpretation on 03/31/22 at 11:37 am to provider [facility] , who verbally acknowledged these results. Electronically Signed   By: Gaylyn Rong M.D.   On: 03-31-22 11:56   DG Chest Portable 1 View  Result Date: 03/31/2022 CLINICAL DATA:  81 year old male status post cardiac arrest and CPR. EXAM: PORTABLE CHEST 1 VIEW COMPARISON:  Chest radiographs 12/27/2016. FINDINGS: Portable AP supine view at 1035 hours. Pacer or resuscitation pads project over the left lower chest. Intubated. Endotracheal tube tip in good position between the clavicles and carina. Lower lung volumes. Mediastinal contours appear stable and within normal limits. Allowing for portable technique the lungs are clear. Skin fold artifact in the upper lungs suspected. No pneumothorax or pleural effusion identified on this supine view. Mild gaseous distension of the stomach. No acute osseous abnormality identified. IMPRESSION: 1. Endotracheal tube tip in good position. 2. Lower lung volumes with no acute cardiopulmonary abnormality identified. Electronically Signed   By: Odessa Fleming M.D.   On: 03/31/2022 10:49     I have reviewed the images obtained: Ct cervical spine:  Unstable type 2 odontoid fracture with 6mm posterior displacement, fracture of posterior element of C1 Ct head: No acute intracranial findings  Primary Diagnosis: Hypoxic  ischemic brain injury    Impression: [name] with hypoxic ischemic brian injury after fall and cardiac arrest with 11 minutes of CPR before ROSC.  Also has unstable C2 fracture.  Considering poor neuro exam on minimal sedation, prognosis is poor.  Neurology will continue to follow for changes in exam over the next few days.  Recommendations: Continue to follow Taylor for changes in neurological exam Brain MRI at 7 days post arrest to evaluate for hypoxic ischemic brain injury   Faatima Tench E Ernestina Columbia , MSN, AGACNP-BC [facility] See Amion for schedule [phone] [phone] information March 31, 2022 1:55 PM

## 2022-03-19 NOTE — ED Provider Notes (Signed)
[facility] EMERGENCY DEPARTMENT Provider Note   CSN: [id number] Arrival date & time: 2022-03-20  1020     History  Chief Complaint  Patient presents with   Cardiac Arrest    [name] is a 81 y.o. male.  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 Pearlean Brownie, monitoring) who presents with concern for cardiac arrest.  He was in a normal state of health this morning, and was speaking to a neighbor.  He then went into the backyard.  Shortly after this, his neighbor found him down and unresponsive.  It is not clear if he had had a fall and become unresponsive, or if he had a syncopal event and fell.  His neighbor is a Theatre stage manager and was at the scene and began CPR.  When the fire department arrived, they found him to have bradycardia with a heart rate in the 30s, and hypoxia with oxygen sats of 30%.  Shortly after this, he developed cardiac arrest, and the firefighters began CPR.  EMS arrived, found him to be in asystole, and continued CPR and gave him epinephrine.  A King airway was placed and he was given 1 mg of epinephrine.  Following this, he had ROSC.  He had 11 minutes of CPR.  EMS reports he was in grass/was not fall on hard cement.  He may have fallen into an electric fence.  During transport, he maintained normal blood pressures, however was not noted to have any movements or reaction to pain.   He had not started any new medications, had not had chest pain, shortness of breath, fever, cough, vomiting or other concerns. He did not feel well on Sunday with fatigue but felt better. He had not been eating as much that day and had been working a lot out in the yard.     Past Medical History:  Diagnosis Date   Arthritis    Diabetes mellitus without complication (HCC)    GERD (gastroesophageal reflux disease) 12/26/2016   History of kidney stones    Lithotripsy    Hypertension    Stroke [facility])    Light Stroke during episode of vertigo - April 2018     Home Medications Prior to Admission medications   Medication Sig Start Date End Date Taking? Authorizing Provider  amLODipine (NORVASC) 10 MG tablet Take 10 mg by mouth daily.    Yes [provider]  clopidogrel (PLAVIX) 75 MG tablet TAKE 1 TABLET BY MOUTH EVERY DAY Patient taking differently: Take 75 mg by mouth in the morning. 01/27/21  Yes Micki Riley, MD  ezetimibe (ZETIA) 10 MG tablet Take 10 mg by mouth daily. 12/01/19  Yes [provider]  gabapentin (NEURONTIN) 300 MG capsule Take 300 mg by mouth 2 (two) times daily. 02/06/22  Yes [provider]  glimepiride (AMARYL) 1 MG tablet Take 1 mg by mouth daily.    Yes [provider]  LORazepam (ATIVAN) 1 MG tablet Take 1 mg by mouth at bedtime. 11/30/19  Yes [provider]  losartan-hydrochlorothiazide (HYZAAR) 50-12.5 MG tablet Take 1 tablet by mouth daily. 10/09/15  Yes [provider]  metFORMIN (GLUCOPHAGE) 500 MG tablet Take 500 mg by mouth 2 (two) times daily with a meal.   Yes [provider]  Multiple Vitamin (MULTIVITAMIN WITH MINERALS) TABS Take 1 tablet by mouth daily after breakfast.   Yes [provider]  pioglitazone (ACTOS) 15 MG tablet Take 15 mg by mouth daily.   Yes [provider]  TYLENOL 8 HOUR ARTHRITIS PAIN 650 MG CR tablet Take 650-1,330 mg by mouth See admin instructions. Take 1,300 mg by mouth at bedtime and an additional 650-1,330 mg once daily as needed for pain   Yes [provider]  ZANTAC 360 10 MG tablet Take 10 mg by mouth daily before breakfast.   Yes [provider]  HYDROcodone-acetaminophen (NORCO/VICODIN) 5-325 MG tablet Take 1-2 tablets by mouth every 4 (four) hours as needed (pain). Patient not taking: Reported on 03-06-2022 05/29/18   Jovita Gamma, MD  meclizine (ANTIVERT) 25 MG tablet Take 1 tablet (25 mg  total) by mouth 3 (three) times daily as needed for dizziness. Patient not taking: Reported on 03-06-22 12/28/16   Rosita Fire, MD      Allergies    Biaxin [clarithromycin], Codeine, Levaquin [levofloxacin], and Pravachol [pravastatin]    Review of Systems   Review of Systems  Physical Exam Updated Vital Signs BP 126/62   Pulse 76   Temp (!) 96.4 F (35.8 C) (Temporal)   Resp 18   Ht 6\' 1"  (1.854 m)   Wt 81.2 kg   SpO2 100%   BMI 23.62 kg/m  Physical Exam Vitals and nursing note reviewed.  Constitutional:      Appearance: He is well-developed. He is ill-appearing.  HENT:     Head: Normocephalic.     Comments: Forehead/nose abrasion/hematoma Eyes:     Conjunctiva/sclera: Conjunctivae normal.     Comments: Pupils 65mm, nonreactive to light  Cardiovascular:     Rate and Rhythm: Regular rhythm. Tachycardia present.     Heart sounds: Normal heart sounds.  Pulmonary:     Effort: No respiratory distress.     Breath sounds: Normal breath sounds. No wheezing or rales.     Comments: King airway in place Abdominal:     General: There is no distension.     Palpations: Abdomen is soft.     Tenderness: There is no abdominal tenderness. There is no guarding.  Musculoskeletal:     Cervical back: Normal range of motion.  Skin:    General: Skin is warm and dry.  Neurological:     Mental Status: He is unresponsive.     GCS: GCS eye subscore is 1. GCS verbal subscore is 1. GCS motor subscore is 1.     ED Results / Procedures / Treatments   Labs (all labs ordered are listed, but only abnormal results are displayed) Labs Reviewed  CBC WITH DIFFERENTIAL/PLATELET - Abnormal; Notable for the following components:      Result Value   Monocytes Absolute 1.1 (*)    Abs Immature Granulocytes 0.23 (*)    All other components within normal limits  COMPREHENSIVE METABOLIC PANEL - Abnormal; Notable for the following components:   Glucose, Bld 291 (*)    Creatinine, Ser 1.48  (*)    Calcium 8.8 (*)    AST 70 (*)    ALT 62 (*)    GFR, Estimated 48 (*)    All other components within normal limits  LACTIC ACID, PLASMA - Abnormal; Notable for the following components:   Lactic Acid, Venous 6.9 (*)    All other components within normal limits  I-STAT CHEM 8, ED - Abnormal; Notable for the following components:   Glucose, Bld 291 (*)    Calcium, Ion 1.14 (*)    All other  components within normal limits  I-STAT ARTERIAL BLOOD GAS, ED - Abnormal; Notable for the following components:   pO2, Arterial 325 (*)    Acid-base deficit 3.0 (*)    HCT 36.0 (*)    Hemoglobin 12.2 (*)    All other components within normal limits  CBG MONITORING, ED - Abnormal; Notable for the following components:   Glucose-Capillary 224 (*)    All other components within normal limits  CULTURE, BLOOD (ROUTINE X 2)  CULTURE, BLOOD (ROUTINE X 2)  MAGNESIUM  ETHANOL  LACTIC ACID, PLASMA  RAPID URINE DRUG SCREEN, HOSP PERFORMED  BLOOD GAS, ARTERIAL  HEMOGLOBIN A1C  I-STAT ARTERIAL BLOOD GAS, ED  TROPONIN I (HIGH SENSITIVITY)  TROPONIN I (HIGH SENSITIVITY)    EKG EKG Interpretation  Date/Time:  Wednesday 04/01/22 10:27:11 EDT Ventricular Rate:  127 PR Interval:    QRS Duration: 100 QT Interval:  402 QTC Calculation: 585 R Axis:   41 Text Interpretation: Sinus tachycardia Minimal ST depression, anterior leads Minimal ST elevation, inferior leads Prolonged QT interval Confirmed by Alvira Monday ([id number]) on 2022/04/01 10:54:59 AM  Radiology CT Head Wo Contrast  Result Date: 04/01/2022 CLINICAL DATA:  Head/neck trauma, cardiac arrest. EXAM: CT HEAD WITHOUT CONTRAST CT MAXILLOFACIAL WITHOUT CONTRAST CT CERVICAL SPINE WITHOUT CONTRAST TECHNIQUE: Multidetector CT imaging of the head, cervical spine, and maxillofacial structures were performed using the standard protocol without intravenous contrast. Multiplanar CT image reconstructions of the cervical spine and maxillofacial  structures were also generated. RADIATION DOSE REDUCTION: This exam was performed according to the departmental dose-optimization program which includes automated exposure control, adjustment of the mA and/or kV according to patient size and/or use of iterative reconstruction technique. COMPARISON:  CT head 12/14/2017 and CT neck 12/26/2016 FINDINGS: CT HEAD FINDINGS Brain: Periventricular white matter and corona radiata hypodensities favor chronic ischemic microvascular white matter disease. Otherwise, the brainstem, cerebellum, cerebral peduncles, thalamus, basal ganglia, basilar cisterns, and ventricular system appear within normal limits. No intracranial hemorrhage, mass lesion, or acute CVA. Vascular: There is atherosclerotic calcification of the cavernous carotid arteries bilaterally. Skull: Upper cervical spine fractures noted, please refer to cervical spine sections of this report. No acute calvarial fracture is identified. Other: The patient is orally intubated. CT MAXILLOFACIAL FINDINGS Osseous: Upper cervical spine fracture noted, see report below. No facial fracture is observed. Orbits: Unremarkable Sinuses: There is chronic ethmoid, left frontal, right sphenoid, and left maxillary sinusitis. Soft tissues: Mild soft tissue swelling along the right forehead. CT CERVICAL SPINE FINDINGS Alignment: No substantial intervertebral malalignment aside from the subluxation of the unstable odontoid fracture. Skull base and vertebrae: C1: Segmental fracture of the ring of C1 with transverse involvement of the right anterior lamina on image 24 series 10; and segmental involvement of the posterior lamina spinous process region as shown on image 25 series 10. On coronal images 30-31 of series 9, vertical malalignment of the left lamina with the spinous process and right lamina is observed with about 6 mm of inferior displacement of the left lamina. C2: There is an unstable type 2 odontoid fracture with 6 mm of  posterior displacement of the odontoid fragment with respect to the body of C2. There is high density material potentially reflecting pannus and/or blood products posterior to the odontoid, with only about 5-6 mm of anterior posterior space for the cord in this vicinity. No other definite cervical spine fractures. Discontinuities in the anterior osteophytes at C2-3, C3-4, and C6-7 appear to likely be chronic when compared to  12/26/2016. Fused facet joint on the right at C2-3. Soft tissues and spinal canal: As noted above, there is substantial narrowing of the space for the upper cervical cord/medulla at the C1-2 level due to anterior epidural pannus/hematoma and the unstable odontoid fracture. Disc levels: Spurring causes right foraminal impingement at C2-3 and C3-4, and left foraminal impingement C3-4 and C4-5. Multilevel mild to moderate central narrowing of the thecal sac in the upper cervical spine due to spurring. Upper chest: Unremarkable Other: No supplemental non-categorized findings. IMPRESSION: 1. Unstable type 2 odontoid fracture with up to 6 mm posterior displacement. Retro odontoid hematoma and/or pannus narrows the AP diameter of the thecal sac to 5- 6 mm in this vicinity. 2. Fractures involving the posterior elements of C1, with a segmental fracture of the right lamina with anterior and posterior components, and a fracture of the left lamina near the spinous process, resulting in a separate spinous process component. 3. No other cervical spine fractures are identified although there is multilevel impingement due to cervical spondylosis. 4. No acute intracranial findings. 5. No acute facial fractures. 6. Chronic paranasal sinusitis. Mild soft tissue swelling along the right forehead. Critical Value/emergent results were called by telephone at the time of interpretation on March 25, 2022 at 11:37 am to provider [facility] , who verbally acknowledged these results. Electronically Signed   By: Van Clines M.D.   On: 03/25/22 11:56   CT Cervical Spine Wo Contrast  Result Date: Mar 25, 2022 CLINICAL DATA:  Head/neck trauma, cardiac arrest. EXAM: CT HEAD WITHOUT CONTRAST CT MAXILLOFACIAL WITHOUT CONTRAST CT CERVICAL SPINE WITHOUT CONTRAST TECHNIQUE: Multidetector CT imaging of the head, cervical spine, and maxillofacial structures were performed using the standard protocol without intravenous contrast. Multiplanar CT image reconstructions of the cervical spine and maxillofacial structures were also generated. RADIATION DOSE REDUCTION: This exam was performed according to the departmental dose-optimization program which includes automated exposure control, adjustment of the mA and/or kV according to patient size and/or use of iterative reconstruction technique. COMPARISON:  CT head 12/14/2017 and CT neck 12/26/2016 FINDINGS: CT HEAD FINDINGS Brain: Periventricular white matter and corona radiata hypodensities favor chronic ischemic microvascular white matter disease. Otherwise, the brainstem, cerebellum, cerebral peduncles, thalamus, basal ganglia, basilar cisterns, and ventricular system appear within normal limits. No intracranial hemorrhage, mass lesion, or acute CVA. Vascular: There is atherosclerotic calcification of the cavernous carotid arteries bilaterally. Skull: Upper cervical spine fractures noted, please refer to cervical spine sections of this report. No acute calvarial fracture is identified. Other: The patient is orally intubated. CT MAXILLOFACIAL FINDINGS Osseous: Upper cervical spine fracture noted, see report below. No facial fracture is observed. Orbits: Unremarkable Sinuses: There is chronic ethmoid, left frontal, right sphenoid, and left maxillary sinusitis. Soft tissues: Mild soft tissue swelling along the right forehead. CT CERVICAL SPINE FINDINGS Alignment: No substantial intervertebral malalignment aside from the subluxation of the unstable odontoid fracture. Skull base and  vertebrae: C1: Segmental fracture of the ring of C1 with transverse involvement of the right anterior lamina on image 24 series 10; and segmental involvement of the posterior lamina spinous process region as shown on image 25 series 10. On coronal images 30-31 of series 9, vertical malalignment of the left lamina with the spinous process and right lamina is observed with about 6 mm of inferior displacement of the left lamina. C2: There is an unstable type 2 odontoid fracture with 6 mm of posterior displacement of the odontoid fragment with respect to the body of C2. There is high density  material potentially reflecting pannus and/or blood products posterior to the odontoid, with only about 5-6 mm of anterior posterior space for the cord in this vicinity. No other definite cervical spine fractures. Discontinuities in the anterior osteophytes at C2-3, C3-4, and C6-7 appear to likely be chronic when compared to 12/26/2016. Fused facet joint on the right at C2-3. Soft tissues and spinal canal: As noted above, there is substantial narrowing of the space for the upper cervical cord/medulla at the C1-2 level due to anterior epidural pannus/hematoma and the unstable odontoid fracture. Disc levels: Spurring causes right foraminal impingement at C2-3 and C3-4, and left foraminal impingement C3-4 and C4-5. Multilevel mild to moderate central narrowing of the thecal sac in the upper cervical spine due to spurring. Upper chest: Unremarkable Other: No supplemental non-categorized findings. IMPRESSION: 1. Unstable type 2 odontoid fracture with up to 6 mm posterior displacement. Retro odontoid hematoma and/or pannus narrows the AP diameter of the thecal sac to 5- 6 mm in this vicinity. 2. Fractures involving the posterior elements of C1, with a segmental fracture of the right lamina with anterior and posterior components, and a fracture of the left lamina near the spinous process, resulting in a separate spinous process component.  3. No other cervical spine fractures are identified although there is multilevel impingement due to cervical spondylosis. 4. No acute intracranial findings. 5. No acute facial fractures. 6. Chronic paranasal sinusitis. Mild soft tissue swelling along the right forehead. Critical Value/emergent results were called by telephone at the time of interpretation on 2022/03/03 at 11:37 am to provider [facility] , who verbally acknowledged these results. Electronically Signed   By: Van Clines M.D.   On: 03/03/2022 11:56   CT Maxillofacial Wo Contrast  Result Date: March 03, 2022 CLINICAL DATA:  Head/neck trauma, cardiac arrest. EXAM: CT HEAD WITHOUT CONTRAST CT MAXILLOFACIAL WITHOUT CONTRAST CT CERVICAL SPINE WITHOUT CONTRAST TECHNIQUE: Multidetector CT imaging of the head, cervical spine, and maxillofacial structures were performed using the standard protocol without intravenous contrast. Multiplanar CT image reconstructions of the cervical spine and maxillofacial structures were also generated. RADIATION DOSE REDUCTION: This exam was performed according to the departmental dose-optimization program which includes automated exposure control, adjustment of the mA and/or kV according to patient size and/or use of iterative reconstruction technique. COMPARISON:  CT head 12/14/2017 and CT neck 12/26/2016 FINDINGS: CT HEAD FINDINGS Brain: Periventricular white matter and corona radiata hypodensities favor chronic ischemic microvascular white matter disease. Otherwise, the brainstem, cerebellum, cerebral peduncles, thalamus, basal ganglia, basilar cisterns, and ventricular system appear within normal limits. No intracranial hemorrhage, mass lesion, or acute CVA. Vascular: There is atherosclerotic calcification of the cavernous carotid arteries bilaterally. Skull: Upper cervical spine fractures noted, please refer to cervical spine sections of this report. No acute calvarial fracture is identified. Other: The patient  is orally intubated. CT MAXILLOFACIAL FINDINGS Osseous: Upper cervical spine fracture noted, see report below. No facial fracture is observed. Orbits: Unremarkable Sinuses: There is chronic ethmoid, left frontal, right sphenoid, and left maxillary sinusitis. Soft tissues: Mild soft tissue swelling along the right forehead. CT CERVICAL SPINE FINDINGS Alignment: No substantial intervertebral malalignment aside from the subluxation of the unstable odontoid fracture. Skull base and vertebrae: C1: Segmental fracture of the ring of C1 with transverse involvement of the right anterior lamina on image 24 series 10; and segmental involvement of the posterior lamina spinous process region as shown on image 25 series 10. On coronal images 30-31 of series 9, vertical malalignment of the left lamina with  the spinous process and right lamina is observed with about 6 mm of inferior displacement of the left lamina. C2: There is an unstable type 2 odontoid fracture with 6 mm of posterior displacement of the odontoid fragment with respect to the body of C2. There is high density material potentially reflecting pannus and/or blood products posterior to the odontoid, with only about 5-6 mm of anterior posterior space for the cord in this vicinity. No other definite cervical spine fractures. Discontinuities in the anterior osteophytes at C2-3, C3-4, and C6-7 appear to likely be chronic when compared to 12/26/2016. Fused facet joint on the right at C2-3. Soft tissues and spinal canal: As noted above, there is substantial narrowing of the space for the upper cervical cord/medulla at the C1-2 level due to anterior epidural pannus/hematoma and the unstable odontoid fracture. Disc levels: Spurring causes right foraminal impingement at C2-3 and C3-4, and left foraminal impingement C3-4 and C4-5. Multilevel mild to moderate central narrowing of the thecal sac in the upper cervical spine due to spurring. Upper chest: Unremarkable Other: No  supplemental non-categorized findings. IMPRESSION: 1. Unstable type 2 odontoid fracture with up to 6 mm posterior displacement. Retro odontoid hematoma and/or pannus narrows the AP diameter of the thecal sac to 5- 6 mm in this vicinity. 2. Fractures involving the posterior elements of C1, with a segmental fracture of the right lamina with anterior and posterior components, and a fracture of the left lamina near the spinous process, resulting in a separate spinous process component. 3. No other cervical spine fractures are identified although there is multilevel impingement due to cervical spondylosis. 4. No acute intracranial findings. 5. No acute facial fractures. 6. Chronic paranasal sinusitis. Mild soft tissue swelling along the right forehead. Critical Value/emergent results were called by telephone at the time of interpretation on 04-Mar-2022 at 11:37 am to provider [facility] , who verbally acknowledged these results. Electronically Signed   By: Van Clines M.D.   On: 03-04-22 11:56   DG Chest Portable 1 View  Result Date: 03-04-22 CLINICAL DATA:  81 year old male status post cardiac arrest and CPR. EXAM: PORTABLE CHEST 1 VIEW COMPARISON:  Chest radiographs 12/27/2016. FINDINGS: Portable AP supine view at 1035 hours. Pacer or resuscitation pads project over the left lower chest. Intubated. Endotracheal tube tip in good position between the clavicles and carina. Lower lung volumes. Mediastinal contours appear stable and within normal limits. Allowing for portable technique the lungs are clear. Skin fold artifact in the upper lungs suspected. No pneumothorax or pleural effusion identified on this supine view. Mild gaseous distension of the stomach. No acute osseous abnormality identified. IMPRESSION: 1. Endotracheal tube tip in good position. 2. Lower lung volumes with no acute cardiopulmonary abnormality identified. Electronically Signed   By: Genevie Ann M.D.   On: 04-Mar-2022 10:49     Procedures .Critical Care  Performed by: Gareth Morgan, MD Authorized by: Gareth Morgan, MD   Critical care provider statement:    Critical care time (minutes):  30   Critical care was time spent personally by me on the following activities:  Development of treatment plan with patient or surrogate, discussions with consultants, evaluation of patient's response to treatment, examination of patient, ordering and review of laboratory studies, ordering and review of radiographic studies, ordering and performing treatments and interventions, pulse oximetry, re-evaluation of patient's condition and review of old charts     Medications Ordered in ED Medications  fentaNYL (SUBLIMAZE) injection 25 mcg (25 mcg Intravenous Given 2022/03/04 1246)  fentaNYL (SUBLIMAZE) injection 25-100 mcg (has no administration in time range)  propofol (DIPRIVAN) 1000 MG/100ML infusion (0 mcg/kg/min  81.2 kg Intravenous Paused 2022/03/08 1130)  etomidate (AMIDATE) injection (10 mg Intravenous Given 03-08-22 1022)  rocuronium (ZEMURON) injection (70 mg Intravenous Given March 08, 2022 1022)  docusate (COLACE) 50 MG/5ML liquid 100 mg (has no administration in time range)  polyethylene glycol (MIRALAX / GLYCOLAX) packet 17 g (has no administration in time range)  ondansetron (ZOFRAN) injection 4 mg (has no administration in time range)  acetaminophen (TYLENOL) tablet 650 mg (has no administration in time range)    Or  acetaminophen (TYLENOL) 160 MG/5ML solution 650 mg (has no administration in time range)    Or  acetaminophen (TYLENOL) suppository 650 mg (has no administration in time range)  norepinephrine (LEVOPHED) 4mg  in 24mL (0.016 mg/mL) premix infusion (2 mcg/min Intravenous New Bag/Given 2022/03/08 1300)  0.9 %  sodium chloride infusion (0 mLs Intravenous Hold 2022/03/08 1221)  heparin injection 5,000 Units (has no administration in time range)  pantoprazole (PROTONIX) injection 40 mg (has no administration in time  range)  insulin aspart (novoLOG) injection 0-15 Units (5 Units Subcutaneous Given 03-08-22 1311)  sodium chloride 0.9 % bolus 500 mL (0 mLs Intravenous Stopped Mar 08, 2022 1210)  lactated ringers bolus 1,000 mL (1,000 mLs Intravenous New Bag/Given 08-Mar-2022 1253)    ED Course/ Medical Decision Making/ A&P                           Medical Decision Making Amount and/or Complexity of Data Reviewed Labs: ordered. Radiology: ordered.  Risk Prescription drug management. Decision regarding hospitalization.    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 Leonie Man, monitoring) who presents with concern for cardiac arrest with hypoxia/bradycardia preceding asystole with arrest and CPR for 11 minutes and ROSC.  Arrives with sinus tachycardia and king airway in place.  Pupils not reactive to light on arrival and he did not show any movement of face/eyes or extremities.  Intubation performed and work up initiated to evaluate for underlying cause of arrest.  No significant electrolyte abnormalities noted. No anemia or leukocytosis.   Discussed with Dr. Gwenlyn Found of cardiology given initial bradycardia and arrest and will follow along and consult as needed pending recovery. Troponin WNL.  CXR with ETT in appropriate position, no other acute abnormalities/pneumothorax/pneumonia or etiology of arrest.   CT Head and CSpine completed to evaluate for signs of trauma or SAH as etiology of arrest.  CT shows no evidence of ICH, however does show significant CSpine fracture of C1 and C2.  Unclear if he suffered a fall leading to CSpine fracture followed by respiratory arrest or ir he suffered syncope related to bradycardia/other etiology followed by cardiac arrest.  Did discuss initial management with large group of supportive family in the consultation room including wife, 2 children, in laws and grandchildren.  Wife  and 2 children came to bedside as we and ICU team discussed care.  Consulted Dr. Arnoldo Morale regarding his CSpine fractures who will come to bedside for evaluation and ICU will also be consulting Neurology regarding concern for possible anoxic brain injury related to cardiac arrest and potentially preceding hypoxia.  Admitted to ICU in critical condition.          Final Clinical Impression(s) / ED Diagnoses Final diagnoses:  Cardiac arrest ([place])  C1-C2 instability  Closed fracture of cervical vertebra,  unspecified cervical vertebral level, initial encounter [facility])    Rx / [place] Orders ED Discharge Orders     None         Gareth Morgan, MD 03/07/22 1336

## 2022-03-19 NOTE — Progress Notes (Signed)
Pt pronounced dead at 1843 by 2 RNs, myself and Darletta Moll, RN, family at bedside notified of the time of death. MD notified, [facility] called and given time of death.

## 2022-03-19 NOTE — Progress Notes (Signed)
   Mar 22, 2022 1520  Clinical Encounter Type  Visited With Patient and family together;Health care provider  Visit Type Critical Care;Initial  Referral From Nurse Alma Friendly K. Young, Therapist, sports)  Consult/Referral To Chaplain Melvenia Beam)  Recommendations POST CPR  Spiritual Encounters  Spiritual Needs Prayer;Emotional;Grief support   Paged to meet with the family of Mr. [name] in 2 Heart Waiting Area. Met Patient's wife of 44 years [name]; CWU:GQBVQ [place]; Daughter [name] and accompanied family while visiting patient. Extended family - Granddaughter/spouses and great grandchildren to provide support. Chaplain provided meaningful presence and reflective listening to family. Chaplain provided refreshment - ordered compassion tray for family. Family is bedside and comforting one another. Community interim pastor has been present and provided additional spiritual care and prayer for family. [address] [place], [place] [phone]

## 2022-03-19 NOTE — Death Summary Note (Signed)
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  In the ER he was noted to have a C1-2 fracture.  He was hemodynamically stable.  NSGY was consulted and stated that he has a very high likelihood of paraplegia.  He could not provide history due to being encephalopathic. He was admitted to the ICU on mechanical ventilation with plans for targeted temperature management. After admission to the ICU he developed Mobitz II related bradycardia.   Neurosurgery saw him and said that he would be paraplegic due to his C1/2 fracture We met with the patient's wife and children: We discussed goals of care for [name] .  We discussed Dr. Arnoldo Morale' findings from the CT scan of his cervical spine showing high grade stenosis, C2 dens fracture and C1 fracture and high likelihood of permanent quadriplegia.  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  I explained that we could support him for the next 72 hours to see if  his mental status improved so he could engage in the conversation.  However they felt that he would not want to wake up to learn that he is quadriplegic.  They elected to withdraw care rather than continue ongoing life support.  he died peacefully with family at bedside.   Pertinent Labs and Studies  Significant Diagnostic Studies ECHOCARDIOGRAM COMPLETE  Result Date: 03-Mar-2022    ECHOCARDIOGRAM REPORT   Patient Name:   [name] Date of Exam: 03-Mar-2022 Medical Rec #:  [id number]            Height:       73.0 in Accession #:    [id number]           Weight:       179.0 lb Date of Birth:  1941/04/06            BSA:          2.053 m Patient Age:    81 years             BP:           135/67 mmHg Patient Gender: M                    HR:           65 bpm. Exam Location:  Inpatient Procedure: 2D Echo, Color Doppler and Cardiac Doppler Indications:    Cardiac Arrest i46.9  History:        Patient has prior history of Echocardiogram examinations, most  recent 12/28/2016. Risk Factors:Hypertension and Diabetes.  Sonographer:    Raquel Sarna Senior RDCS Referring Phys: [phone] Silvestre Moment [facility]  Sonographer Comments: Scanned supine on artificial respirator. IMPRESSIONS  1. Left ventricular ejection fraction, by estimation, is 70 to 75%. The left ventricle has hyperdynamic function. The left ventricle has no regional wall motion abnormalities. There is mild left ventricular hypertrophy. Indeterminate diastolic filling due to E-A fusion.  2. Right ventricular systolic function is normal. The right ventricular size is normal. There is normal pulmonary artery systolic pressure.  3. The mitral valve is normal in structure. Trivial mitral valve regurgitation.  4. The aortic valve is tricuspid. Aortic valve regurgitation is not visualized.  5. The inferior vena cava is dilated in size with <50% respiratory variability, suggesting right atrial pressure of 15 mmHg. Comparison(s): The left ventricular function is  unchanged. FINDINGS  Left Ventricle: Left ventricular ejection fraction, by estimation, is 70 to 75%. The left ventricle has hyperdynamic function. The left ventricle has no regional wall motion abnormalities. The left ventricular internal cavity size was normal in size. There is mild left ventricular hypertrophy. Indeterminate diastolic filling due to E-A fusion. Right Ventricle: The right ventricular size is normal. Right vetricular wall thickness was not assessed. Right ventricular systolic function is normal. There is normal pulmonary artery systolic pressure. The tricuspid regurgitant velocity is 2.17 m/s, and with an assumed right atrial pressure of 15 mmHg, the estimated right ventricular systolic pressure is 80.0 mmHg. Left Atrium: Left atrial size was normal in size. Right Atrium: Right atrial size was normal in size. Pericardium: There is no evidence of pericardial effusion. Mitral Valve: The mitral valve is normal in structure. Trivial mitral valve regurgitation. Tricuspid Valve: The tricuspid valve is normal in structure. Tricuspid valve regurgitation is trivial. Aortic Valve: The aortic valve is tricuspid. Aortic valve regurgitation is not visualized. Pulmonic Valve: The pulmonic valve was normal in structure. Pulmonic valve regurgitation is not visualized. Aorta: The aortic root and ascending aorta are structurally normal, with no evidence of dilitation. Venous: The inferior vena cava is dilated in size with less than 50% respiratory variability, suggesting right atrial pressure of 15 mmHg. IAS/Shunts: No atrial level shunt detected by color flow Doppler.  LEFT VENTRICLE PLAX 2D LVIDd:         4.60 cm LVIDs:         2.40 cm LV PW:         1.20 cm LV IVS:        1.00 cm LVOT diam:     2.00 cm LV SV:         70 LV SV Index:   34 LVOT Area:     3.14 cm  RIGHT VENTRICLE RV S prime:     12.30 cm/s TAPSE (M-mode): 2.6 cm LEFT ATRIUM           Index        RIGHT ATRIUM           Index LA diam:      3.30 cm  1.61 cm/m   RA Area:     19.80 cm LA Vol (A2C): 39.4 ml 19.20 ml/m  RA Volume:   51.20 ml  24.94 ml/m LA Vol (A4C): 31.2 ml 15.20 ml/m  AORTIC VALVE LVOT Vmax:   84.20 cm/s LVOT Vmean:  60.500 cm/s LVOT VTI:    0.222 m  AORTA Ao Root diam: 3.00 cm Ao Asc diam:  3.60 cm MITRAL VALVE  TRICUSPID VALVE MV Area (PHT): 3.39 cm     TR Peak grad:   18.8 mmHg MV Decel Time: 224 msec     TR Vmax:        217.00 cm/s MV E velocity: 90.40 cm/s MV A velocity: 116.00 cm/s  SHUNTS MV E/A ratio:  0.78         Systemic VTI:  0.22 m                             Systemic Diam: 2.00 cm Dorris Carnes MD Electronically signed by Dorris Carnes MD Signature Date/Time: 29-Mar-2022/6:10:49 PM    Final    EEG adult  Result Date: 2022/03/29 Lora Havens, MD     March 29, 2022  3:56 PM Patient Name: [name] MRN: [id number] Epilepsy Attending: Lora Havens Referring Physician/Provider: Corey Harold, NP Date:  March 29, 2022 Duration: 22.18 mins Patient history: 81 year old patient with hypoxic ischemic brian injury after fall and cardiac arrest with 11 minutes of CPR before ROSC.  EEG to evaluate for seizure Level of alertness: comatose AEDs during EEG study: Propofol Technical aspects: This EEG study was done with scalp electrodes positioned according to the 10-20 International system of electrode placement. Electrical activity was acquired at a sampling rate of _0  and reviewed with a high frequency filter of _1  and a low frequency filter of _2 . EEG data were recorded continuously and digitally stored. Description: Patient was noted to have brief spontaneous eye opening every few seconds. 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  In between seizures, EEG showed near continuous background suppression.  Hyperventilation and photic stimulation were not performed.   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 Dr. Lynnae Sandhoff was notified. Lora Havens   CT Head Wo Contrast  Result Date: 2022-03-29 CLINICAL DATA:  Head/neck trauma, cardiac arrest. EXAM: CT HEAD WITHOUT CONTRAST CT MAXILLOFACIAL WITHOUT CONTRAST CT CERVICAL SPINE WITHOUT CONTRAST TECHNIQUE: Multidetector CT imaging of the head, cervical spine, and maxillofacial structures were performed using the standard protocol without intravenous contrast. Multiplanar CT image reconstructions of the cervical spine and maxillofacial structures were also generated. RADIATION DOSE REDUCTION: This exam was performed according to the departmental dose-optimization program which includes automated exposure control, adjustment of the mA and/or kV according to patient size and/or use of iterative reconstruction technique. COMPARISON:  CT head 12/14/2017 and CT neck 12/26/2016 FINDINGS: CT HEAD FINDINGS Brain: Periventricular white matter and corona radiata hypodensities favor chronic ischemic microvascular white matter disease. Otherwise, the brainstem, cerebellum, cerebral peduncles, thalamus, basal ganglia, basilar cisterns, and ventricular system appear within normal limits. No intracranial hemorrhage, mass lesion, or acute CVA. Vascular: There is atherosclerotic calcification of the cavernous carotid arteries bilaterally. Skull: Upper cervical spine fractures noted, please refer to cervical spine sections of this report. No acute calvarial fracture is identified. Other: The patient is orally intubated. CT MAXILLOFACIAL FINDINGS Osseous: Upper cervical spine fracture noted, see report below. No facial fracture is observed. Orbits: Unremarkable Sinuses: There is chronic ethmoid, left frontal, right sphenoid, and left maxillary  sinusitis. Soft tissues: Mild soft tissue swelling along the right forehead. CT CERVICAL SPINE FINDINGS Alignment: No substantial intervertebral malalignment aside from the subluxation  of the unstable odontoid fracture. Skull base and vertebrae: C1: Segmental fracture of the ring of C1 with transverse involvement of the right anterior lamina on image 24 series 10; and segmental involvement of the posterior lamina spinous process region as shown on image 25 series 10. On coronal images 30-31 of series 9, vertical malalignment of the left lamina with the spinous process and right lamina is observed with about 6 mm of inferior displacement of the left lamina. C2: There is an unstable type 2 odontoid fracture with 6 mm of posterior displacement of the odontoid fragment with respect to the body of C2. There is high density material potentially reflecting pannus and/or blood products posterior to the odontoid, with only about 5-6 mm of anterior posterior space for the cord in this vicinity. No other definite cervical spine fractures. Discontinuities in the anterior osteophytes at C2-3, C3-4, and C6-7 appear to likely be chronic when compared to 12/26/2016. Fused facet joint on the right at C2-3. Soft tissues and spinal canal: As noted above, there is substantial narrowing of the space for the upper cervical cord/medulla at the C1-2 level due to anterior epidural pannus/hematoma and the unstable odontoid fracture. Disc levels: Spurring causes right foraminal impingement at C2-3 and C3-4, and left foraminal impingement C3-4 and C4-5. Multilevel mild to moderate central narrowing of the thecal sac in the upper cervical spine due to spurring. Upper chest: Unremarkable Other: No supplemental non-categorized findings. IMPRESSION: 1. Unstable type 2 odontoid fracture with up to 6 mm posterior displacement. Retro odontoid hematoma and/or pannus narrows the AP diameter of the thecal sac to 5- 6 mm in this vicinity. 2. Fractures  involving the posterior elements of C1, with a segmental fracture of the right lamina with anterior and posterior components, and a fracture of the left lamina near the spinous process, resulting in a separate spinous process component. 3. No other cervical spine fractures are identified although there is multilevel impingement due to cervical spondylosis. 4. No acute intracranial findings. 5. No acute facial fractures. 6. Chronic paranasal sinusitis. Mild soft tissue swelling along the right forehead. Critical Value/emergent results were called by telephone at the time of interpretation on March 07, 2022 at 11:37 am to provider [facility] , who verbally acknowledged these results. Electronically Signed   By: Van Clines M.D.   On: 03/07/2022 11:56   CT Cervical Spine Wo Contrast  Result Date: 2022/03/07 CLINICAL DATA:  Head/neck trauma, cardiac arrest. EXAM: CT HEAD WITHOUT CONTRAST CT MAXILLOFACIAL WITHOUT CONTRAST CT CERVICAL SPINE WITHOUT CONTRAST TECHNIQUE: Multidetector CT imaging of the head, cervical spine, and maxillofacial structures were performed using the standard protocol without intravenous contrast. Multiplanar CT image reconstructions of the cervical spine and maxillofacial structures were also generated. RADIATION DOSE REDUCTION: This exam was performed according to the departmental dose-optimization program which includes automated exposure control, adjustment of the mA and/or kV according to patient size and/or use of iterative reconstruction technique. COMPARISON:  CT head 12/14/2017 and CT neck 12/26/2016 FINDINGS: CT HEAD FINDINGS Brain: Periventricular white matter and corona radiata hypodensities favor chronic ischemic microvascular white matter disease. Otherwise, the brainstem, cerebellum, cerebral peduncles, thalamus, basal ganglia, basilar cisterns, and ventricular system appear within normal limits. No intracranial hemorrhage, mass lesion, or acute CVA. Vascular: There is  atherosclerotic calcification of the cavernous carotid arteries bilaterally. Skull: Upper cervical spine fractures noted, please refer to cervical spine sections of this report. No acute calvarial fracture is identified. Other: The patient is orally intubated. CT MAXILLOFACIAL FINDINGS Osseous: Upper  cervical spine fracture noted, see report below. No facial fracture is observed. Orbits: Unremarkable Sinuses: There is chronic ethmoid, left frontal, right sphenoid, and left maxillary sinusitis. Soft tissues: Mild soft tissue swelling along the right forehead. CT CERVICAL SPINE FINDINGS Alignment: No substantial intervertebral malalignment aside from the subluxation of the unstable odontoid fracture. Skull base and vertebrae: C1: Segmental fracture of the ring of C1 with transverse involvement of the right anterior lamina on image 24 series 10; and segmental involvement of the posterior lamina spinous process region as shown on image 25 series 10. On coronal images 30-31 of series 9, vertical malalignment of the left lamina with the spinous process and right lamina is observed with about 6 mm of inferior displacement of the left lamina. C2: There is an unstable type 2 odontoid fracture with 6 mm of posterior displacement of the odontoid fragment with respect to the body of C2. There is high density material potentially reflecting pannus and/or blood products posterior to the odontoid, with only about 5-6 mm of anterior posterior space for the cord in this vicinity. No other definite cervical spine fractures. Discontinuities in the anterior osteophytes at C2-3, C3-4, and C6-7 appear to likely be chronic when compared to 12/26/2016. Fused facet joint on the right at C2-3. Soft tissues and spinal canal: As noted above, there is substantial narrowing of the space for the upper cervical cord/medulla at the C1-2 level due to anterior epidural pannus/hematoma and the unstable odontoid fracture. Disc levels: Spurring causes  right foraminal impingement at C2-3 and C3-4, and left foraminal impingement C3-4 and C4-5. Multilevel mild to moderate central narrowing of the thecal sac in the upper cervical spine due to spurring. Upper chest: Unremarkable Other: No supplemental non-categorized findings. IMPRESSION: 1. Unstable type 2 odontoid fracture with up to 6 mm posterior displacement. Retro odontoid hematoma and/or pannus narrows the AP diameter of the thecal sac to 5- 6 mm in this vicinity. 2. Fractures involving the posterior elements of C1, with a segmental fracture of the right lamina with anterior and posterior components, and a fracture of the left lamina near the spinous process, resulting in a separate spinous process component. 3. No other cervical spine fractures are identified although there is multilevel impingement due to cervical spondylosis. 4. No acute intracranial findings. 5. No acute facial fractures. 6. Chronic paranasal sinusitis. Mild soft tissue swelling along the right forehead. Critical Value/emergent results were called by telephone at the time of interpretation on Mar 06, 2022 at 11:37 am to provider [facility] , who verbally acknowledged these results. Electronically Signed   By: Van Clines M.D.   On: 2022/03/06 11:56   CT Maxillofacial Wo Contrast  Result Date: 03/06/2022 CLINICAL DATA:  Head/neck trauma, cardiac arrest. EXAM: CT HEAD WITHOUT CONTRAST CT MAXILLOFACIAL WITHOUT CONTRAST CT CERVICAL SPINE WITHOUT CONTRAST TECHNIQUE: Multidetector CT imaging of the head, cervical spine, and maxillofacial structures were performed using the standard protocol without intravenous contrast. Multiplanar CT image reconstructions of the cervical spine and maxillofacial structures were also generated. RADIATION DOSE REDUCTION: This exam was performed according to the departmental dose-optimization program which includes automated exposure control, adjustment of the mA and/or kV according to patient size  and/or use of iterative reconstruction technique. COMPARISON:  CT head 12/14/2017 and CT neck 12/26/2016 FINDINGS: CT HEAD FINDINGS Brain: Periventricular white matter and corona radiata hypodensities favor chronic ischemic microvascular white matter disease. Otherwise, the brainstem, cerebellum, cerebral peduncles, thalamus, basal ganglia, basilar cisterns, and ventricular system appear within normal limits. No intracranial  hemorrhage, mass lesion, or acute CVA. Vascular: There is atherosclerotic calcification of the cavernous carotid arteries bilaterally. Skull: Upper cervical spine fractures noted, please refer to cervical spine sections of this report. No acute calvarial fracture is identified. Other: The patient is orally intubated. CT MAXILLOFACIAL FINDINGS Osseous: Upper cervical spine fracture noted, see report below. No facial fracture is observed. Orbits: Unremarkable Sinuses: There is chronic ethmoid, left frontal, right sphenoid, and left maxillary sinusitis. Soft tissues: Mild soft tissue swelling along the right forehead. CT CERVICAL SPINE FINDINGS Alignment: No substantial intervertebral malalignment aside from the subluxation of the unstable odontoid fracture. Skull base and vertebrae: C1: Segmental fracture of the ring of C1 with transverse involvement of the right anterior lamina on image 24 series 10; and segmental involvement of the posterior lamina spinous process region as shown on image 25 series 10. On coronal images 30-31 of series 9, vertical malalignment of the left lamina with the spinous process and right lamina is observed with about 6 mm of inferior displacement of the left lamina. C2: There is an unstable type 2 odontoid fracture with 6 mm of posterior displacement of the odontoid fragment with respect to the body of C2. There is high density material potentially reflecting pannus and/or blood products posterior to the odontoid, with only about 5-6 mm of anterior posterior space for  the cord in this vicinity. No other definite cervical spine fractures. Discontinuities in the anterior osteophytes at C2-3, C3-4, and C6-7 appear to likely be chronic when compared to 12/26/2016. Fused facet joint on the right at C2-3. Soft tissues and spinal canal: As noted above, there is substantial narrowing of the space for the upper cervical cord/medulla at the C1-2 level due to anterior epidural pannus/hematoma and the unstable odontoid fracture. Disc levels: Spurring causes right foraminal impingement at C2-3 and C3-4, and left foraminal impingement C3-4 and C4-5. Multilevel mild to moderate central narrowing of the thecal sac in the upper cervical spine due to spurring. Upper chest: Unremarkable Other: No supplemental non-categorized findings. IMPRESSION: 1. Unstable type 2 odontoid fracture with up to 6 mm posterior displacement. Retro odontoid hematoma and/or pannus narrows the AP diameter of the thecal sac to 5- 6 mm in this vicinity. 2. Fractures involving the posterior elements of C1, with a segmental fracture of the right lamina with anterior and posterior components, and a fracture of the left lamina near the spinous process, resulting in a separate spinous process component. 3. No other cervical spine fractures are identified although there is multilevel impingement due to cervical spondylosis. 4. No acute intracranial findings. 5. No acute facial fractures. 6. Chronic paranasal sinusitis. Mild soft tissue swelling along the right forehead. Critical Value/emergent results were called by telephone at the time of interpretation on 03-19-22 at 11:37 am to provider [facility] , who verbally acknowledged these results. Electronically Signed   By: Van Clines M.D.   On: Mar 19, 2022 11:56   DG Chest Portable 1 View  Result Date: 03/19/2022 CLINICAL DATA:  81 year old male status post cardiac arrest and CPR. EXAM: PORTABLE CHEST 1 VIEW COMPARISON:  Chest radiographs 12/27/2016. FINDINGS:  Portable AP supine view at 1035 hours. Pacer or resuscitation pads project over the left lower chest. Intubated. Endotracheal tube tip in good position between the clavicles and carina. Lower lung volumes. Mediastinal contours appear stable and within normal limits. Allowing for portable technique the lungs are clear. Skin fold artifact in the upper lungs suspected. No pneumothorax or pleural effusion identified on this supine view.  Mild gaseous distension of the stomach. No acute osseous abnormality identified. IMPRESSION: 1. Endotracheal tube tip in good position. 2. Lower lung volumes with no acute cardiopulmonary abnormality identified. Electronically Signed   By: Genevie Ann M.D.   On: 18-Mar-2022 10:49    Microbiology Recent Results (from the past 240 hour(s))  Blood culture (routine x 2)     Status: Abnormal (Preliminary result)   Collection Time: 03-18-22 10:40 AM   Specimen: BLOOD  Result Value Ref Range Status   Specimen Description BLOOD BLOOD RIGHT FOREARM  Final   Special Requests   Final    BOTTLES DRAWN AEROBIC AND ANAEROBIC Blood Culture results may not be optimal due to an excessive volume of blood received in culture bottles   Culture  Setup Time   Final    GRAM POSITIVE COCCI ANAEROBIC BOTTLE ONLY CRITICAL RESULT CALLED TO, READ BACK BY AND VERIFIED WITH: DECEASED 03/19/22_0 :15 IN BOTH AEROBIC AND ANAEROBIC BOTTLES    Culture (A)  Final    VIRIDANS STREPTOCOCCUS THE SIGNIFICANCE OF ISOLATING THIS ORGANISM FROM A SINGLE SET OF BLOOD CULTURES WHEN MULTIPLE SETS ARE DRAWN IS UNCERTAIN. PLEASE NOTIFY THE MICROBIOLOGY DEPARTMENT WITHIN ONE WEEK IF SPECIATION AND SENSITIVITIES ARE REQUIRED. CULTURE REINCUBATED FOR BETTER GROWTH Performed at [facility], [place] [address] [place], [place] [address]    Report Status PENDING  Incomplete  Blood culture (routine x 2)     Status: None (Preliminary result)   Collection Time: 03-18-2022 10:40 AM   Specimen: BLOOD  Result Value Ref Range  Status   Specimen Description BLOOD BLOOD LEFT FOREARM  Final   Special Requests   Final    BOTTLES DRAWN AEROBIC AND ANAEROBIC Blood Culture results may not be optimal due to an excessive volume of blood received in culture bottles   Culture   Final    NO GROWTH 2 DAYS Performed at [facility], [place] [address], [place],  [address]    Report Status PENDING  Incomplete  Blood Culture ID Panel (Reflexed)     Status: Abnormal   Collection Time: 03-18-2022 10:40 AM  Result Value Ref Range Status   Enterococcus faecalis NOT DETECTED NOT DETECTED Final   Enterococcus Faecium NOT DETECTED NOT DETECTED Final   Listeria monocytogenes NOT DETECTED NOT DETECTED Final   Staphylococcus species DETECTED (A) NOT DETECTED Final    Comment: CRITICAL RESULT CALLED TO, READ BACK BY AND VERIFIED WITH: DECEASED 03-19-2022_1 :15    Staphylococcus aureus (BCID) NOT DETECTED NOT DETECTED Final   Staphylococcus epidermidis DETECTED (A) NOT DETECTED Final    Comment: CRITICAL RESULT CALLED TO, READ BACK BY AND VERIFIED WITH: DECEASED March 19, 2022_2 :15    Staphylococcus lugdunensis NOT DETECTED NOT DETECTED Final   Streptococcus species DETECTED (A) NOT DETECTED Final    Comment: Not Enterococcus species, Streptococcus agalactiae, Streptococcus pyogenes, or Streptococcus pneumoniae. CRITICAL RESULT CALLED TO, READ BACK BY AND VERIFIED WITH: DECEASED 03/19/2022_3 :15    Streptococcus agalactiae NOT DETECTED NOT DETECTED Final   Streptococcus pneumoniae NOT DETECTED NOT DETECTED Final   Streptococcus pyogenes NOT DETECTED NOT DETECTED Final   A.calcoaceticus-baumannii NOT DETECTED NOT DETECTED Final   Bacteroides fragilis NOT DETECTED NOT DETECTED Final   Enterobacterales NOT DETECTED NOT DETECTED Final   Enterobacter cloacae complex NOT DETECTED NOT DETECTED Final   Escherichia coli NOT DETECTED NOT DETECTED Final   Klebsiella aerogenes NOT DETECTED NOT DETECTED Final   Klebsiella oxytoca NOT DETECTED NOT  DETECTED Final   Klebsiella pneumoniae NOT DETECTED  NOT DETECTED Final   Proteus species NOT DETECTED NOT DETECTED Final   Salmonella species NOT DETECTED NOT DETECTED Final   Serratia marcescens NOT DETECTED NOT DETECTED Final   Haemophilus influenzae NOT DETECTED NOT DETECTED Final   Neisseria meningitidis NOT DETECTED NOT DETECTED Final   Pseudomonas aeruginosa NOT DETECTED NOT DETECTED Final   Stenotrophomonas maltophilia NOT DETECTED NOT DETECTED Final   Candida albicans NOT DETECTED NOT DETECTED Final   Candida auris NOT DETECTED NOT DETECTED Final   Candida glabrata NOT DETECTED NOT DETECTED Final   Candida krusei NOT DETECTED NOT DETECTED Final   Candida parapsilosis NOT DETECTED NOT DETECTED Final   Candida tropicalis NOT DETECTED NOT DETECTED Final   Cryptococcus neoformans/gattii NOT DETECTED NOT DETECTED Final   Methicillin resistance mecA/C NOT DETECTED NOT DETECTED Final    Comment: Performed at [facility], [address] [place], [place] [address]  MRSA Next Gen by PCR, Nasal     Status: None   Collection Time: 2022-03-18  2:18 PM   Specimen: Nasal Mucosa; Nasal Swab  Result Value Ref Range Status   MRSA by PCR Next Gen NOT DETECTED NOT DETECTED Final    Comment: (NOTE) The GeneXpert MRSA Assay (FDA approved for NASAL specimens only), is one component of a comprehensive MRSA colonization surveillance program. It is not intended to diagnose MRSA infection nor to guide or monitor treatment for MRSA infections. Test performance is not FDA approved in patients less than 68 years old. Performed at [facility], [address], [place], [place] [address]     Lab Basic Metabolic Panel: Recent Labs  Lab 18-Mar-2022 1034 03-18-22 1041 03/18/22 1127 03/18/2022 1553  NA 138 138 138 138  K 3.9 3.9 3.6 3.0*  CL 102 101  --   --   CO2 23  --   --   --   GLUCOSE 291* 291*  --   --   BUN 17 18  --   --   CREATININE 1.48* 1.20  --   --   CALCIUM 8.8*  --   --    --   MG 2.0  --   --   --    Liver Function Tests: Recent Labs  Lab 2022-03-18 1034  AST 70*  ALT 62*  ALKPHOS 72  BILITOT 0.6  PROT 6.5  ALBUMIN 3.9   No results for input(s): "LIPASE", "AMYLASE" in the last 168 hours. No results for input(s): "AMMONIA" in the last 168 hours. CBC: Recent Labs  Lab 03/18/2022 1034 03-18-2022 1041 2022-03-18 1127 18-Mar-2022 1553  WBC 10.4  --   --   --   NEUTROABS 6.0  --   --   --   HGB 14.7 14.6 12.2* 12.9*  HCT 44.7 43.0 36.0* 38.0*  MCV 94.3  --   --   --   PLT 209  --   --   --    Cardiac Enzymes: No results for input(s): "CKTOTAL", "CKMB", "CKMBINDEX", "TROPONINI" in the last 168 hours. Sepsis Labs: Recent Labs  Lab 18-Mar-2022 1034 2022-03-18 1444  WBC 10.4  --   LATICACIDVEN 6.9* 3.0*    Procedures/Operations  Endotracheal intubation   Roselie Awkward 03/05/2022, 8:45 AM

## 2022-03-19 NOTE — ED Notes (Signed)
Patient transported to CT with RN 

## 2022-03-19 NOTE — ED Notes (Signed)
Fentanyl  wasted with Thyra Breed, RN

## 2022-03-19 NOTE — Progress Notes (Addendum)
CSW received consult for Primary contact information for patient. CSW spoke with patients daughter Misty Stanley who confirmed to be Primary contact for patient. Misty Stanley informed CSW she will relay information to patients spouse and son.No further questions reported at this time.

## 2022-03-19 NOTE — Progress Notes (Signed)
Patient transported to CT and back to trauma room without complications. RN at bedside. 

## 2022-03-19 NOTE — Progress Notes (Signed)
An USGPIV (ultrasound guided PIV) has been placed for short-term vasopressor infusion. A correctly placed ivWatch must be used when administering Vasopressors. Should this treatment be needed beyond 72 hours, central line access should be obtained.  It will be the responsibility of the bedside nurse to follow best practice to prevent extravasations.   ?

## 2022-03-19 NOTE — ED Notes (Addendum)
Report given to Gertie Gowda, RN of [phone]

## 2022-03-19 NOTE — Progress Notes (Signed)
Pt extubated per order and family request, all family members at bedside, fentanyl drip continues at 128mcg/hr, Dopamine d/c after extubation. No s/s of pain or anxiety noted

## 2022-03-19 NOTE — Progress Notes (Signed)
Patient transported to 2H25 from ED without complications. RN at bedside. ?

## 2022-03-19 DEATH — deceased

## 2022-06-22 IMAGING — CT CT HEAD W/O CM
3 of 4 series · 13 of 47 positions shown, 15 images · non-contrast
Comparison: CT head 12/14/2017 and CT neck 12/26/2016

CLINICAL DATA: Head/neck trauma, cardiac arrest.



[Series 3: head without · axial · non-contrast · 0.48mm/px · z∈[-118,+12]mm · 7 of 36 slices shown, 9 images]
[im 5/36  brain]
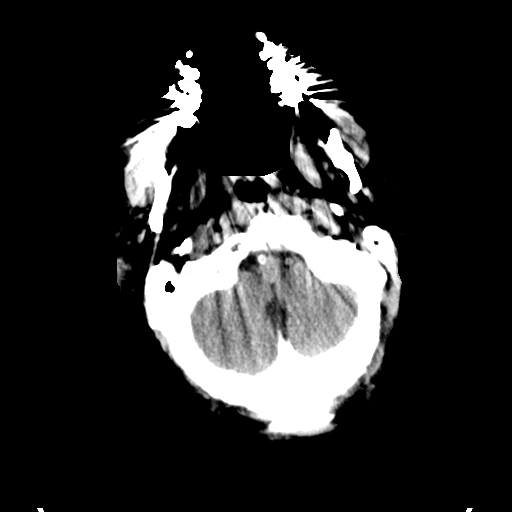
[im 5/36  bone]
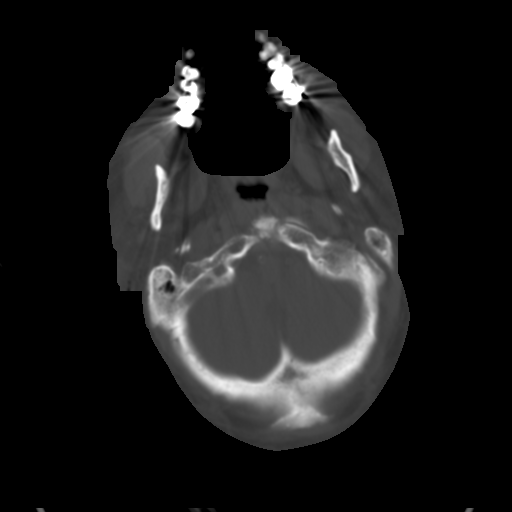
[im 9/36  brain]
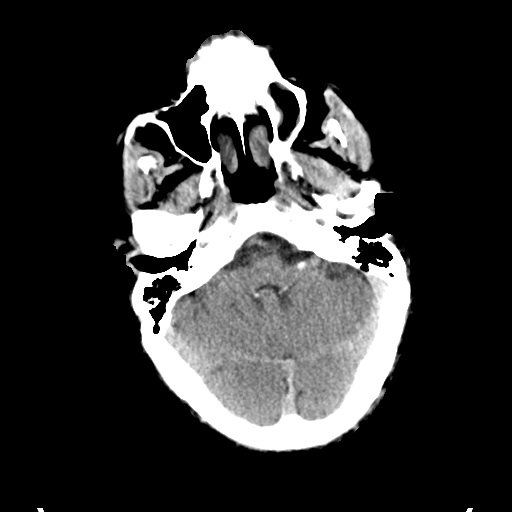
[im 14/36  brain]
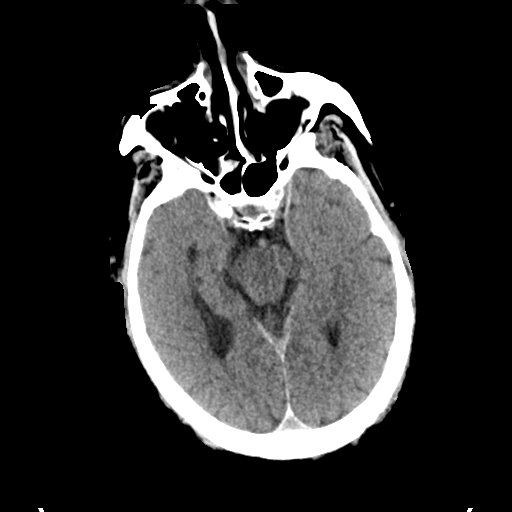
[im 18/36  brain]
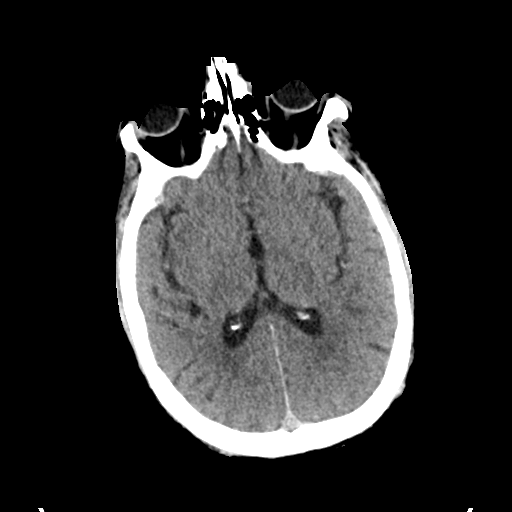
[im 22/36  brain]
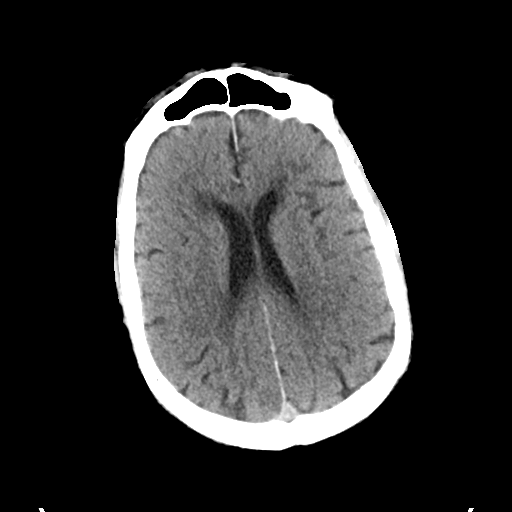
[im 22/36  bone]
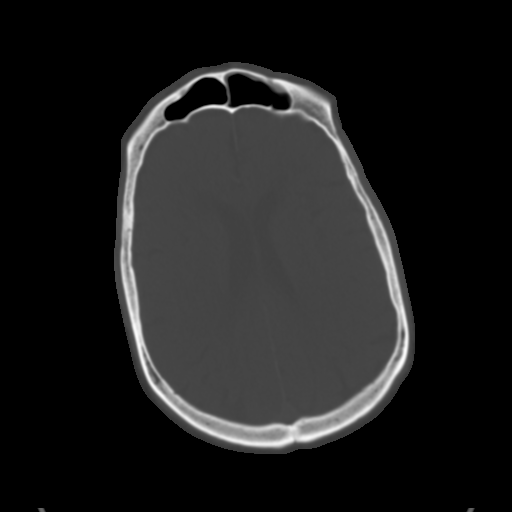
[im 27/36  brain]
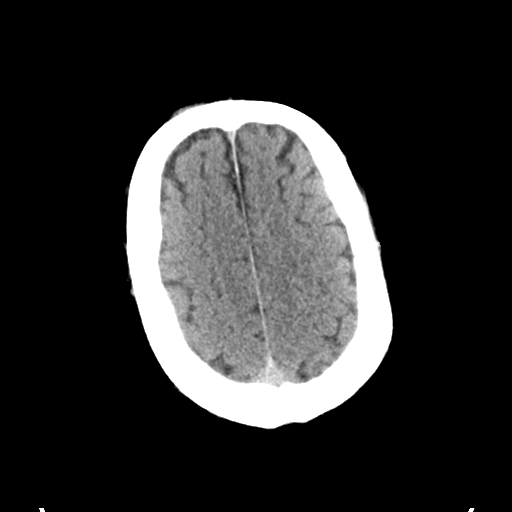
[im 31/36  brain]
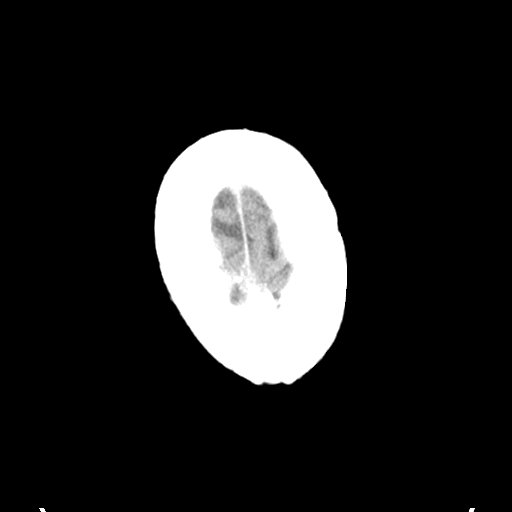

[Series 5: head without cor · coronal · non-contrast · 0.36mm/px · 3 of 77 slices shown]
[im 26/77  brain]
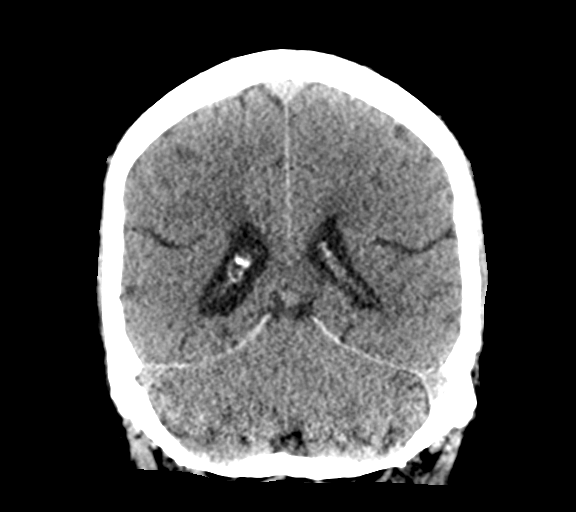
[im 34/77  brain]
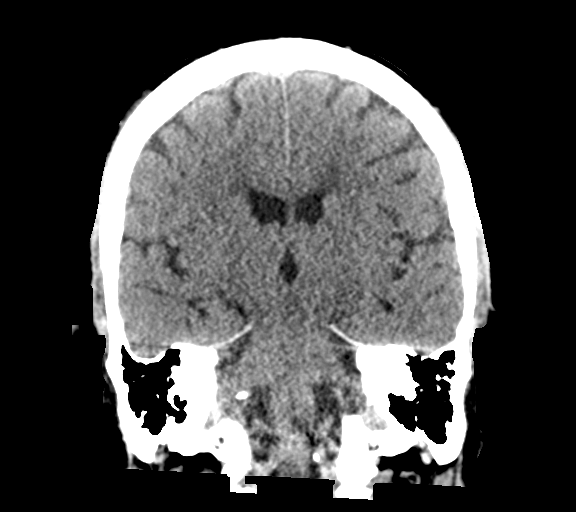
[im 43/77  brain]
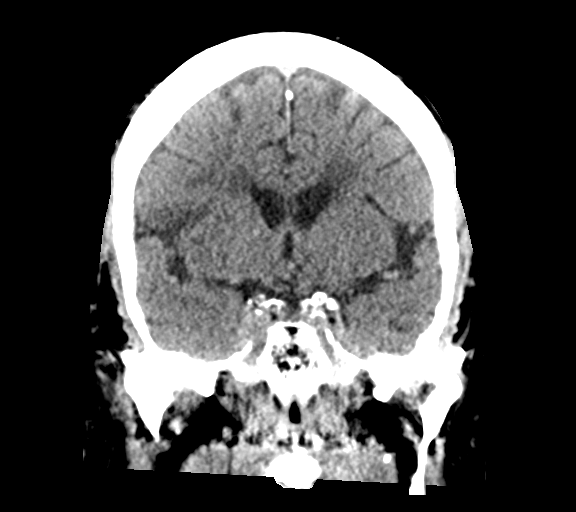

[Series 6: head without sag · sagittal · non-contrast · 0.37mm/px · 3 of 67 slices shown]
[im 23/67  brain]
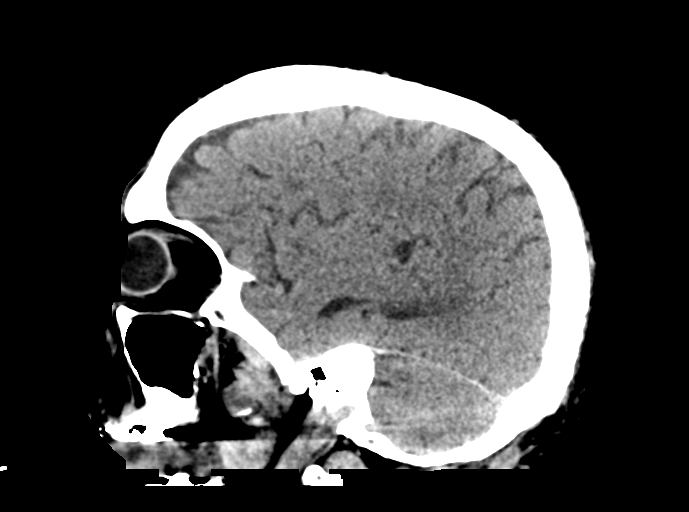
[im 34/67  brain]
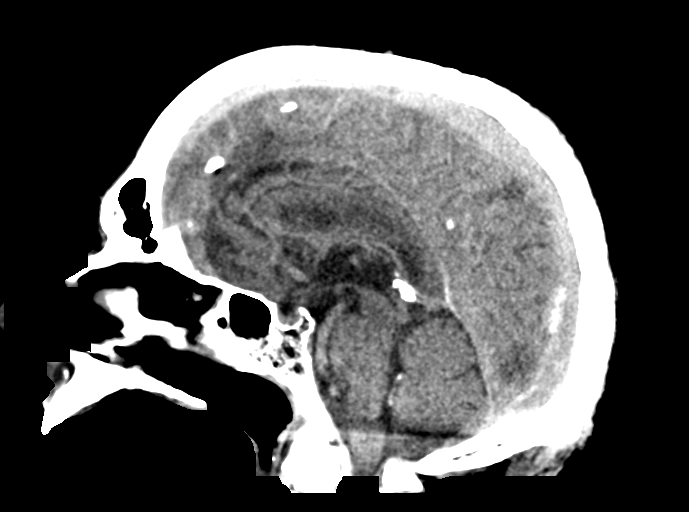
[im 45/67  brain]
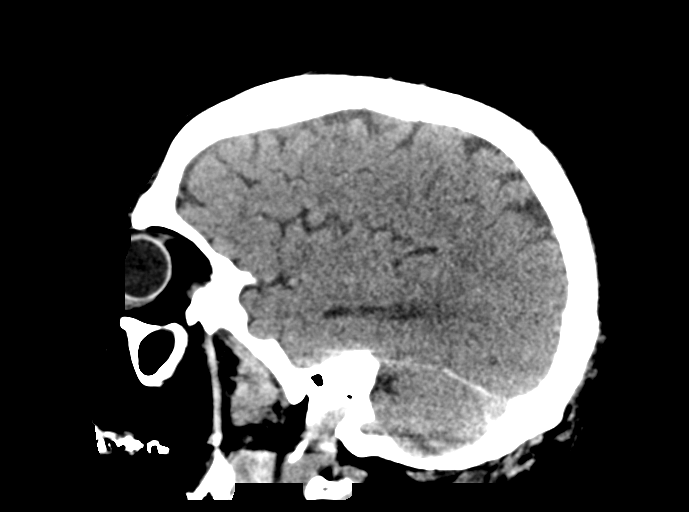

[13 of 47 positions shown; findings below may reference images not displayed]

FINDINGS: CT HEAD FINDINGS

Brain: Periventricular white matter and corona radiata hypodensities
favor chronic ischemic microvascular white matter disease.
Otherwise, the brainstem, cerebellum, cerebral peduncles, thalamus,
basal ganglia, basilar cisterns, and ventricular system appear
within normal limits. No intracranial hemorrhage, mass lesion, or
acute CVA.

Vascular: There is atherosclerotic calcification of the cavernous
carotid arteries bilaterally.

Skull: Upper cervical spine fractures noted, please refer to
cervical spine sections of this report. No acute calvarial fracture
is identified.

Other: The patient is orally intubated.

CT MAXILLOFACIAL FINDINGS

Osseous: Upper cervical spine fracture noted, see report below. No
facial fracture is observed.

Orbits: Unremarkable

Sinuses: There is chronic ethmoid, left frontal, right sphenoid, and
left maxillary sinusitis.

Soft tissues: Mild soft tissue swelling along the right forehead.

CT CERVICAL SPINE FINDINGS

Alignment: No substantial intervertebral malalignment aside from the
subluxation of the unstable odontoid fracture.

Skull base and vertebrae:

C1: Segmental fracture of the ring of C1 with transverse involvement
of the right anterior lamina on image 24 series 10; and segmental
involvement of the posterior lamina spinous process region as shown
on image 25 series 10. On coronal images 30-31 of series 9, vertical
malalignment of the left lamina with the spinous process and right
lamina is observed with about 6 mm of inferior displacement of the
left lamina.

C2: There is an unstable type 2 odontoid fracture with 6 mm of
posterior displacement of the odontoid fragment with respect to the
body of C2. There is high density material potentially reflecting
pannus and/or blood products posterior to the odontoid, with only
about 5-6 mm of anterior posterior space for the cord in this
vicinity.

No other definite cervical spine fractures. Discontinuities in the
anterior osteophytes at C2-3, C3-4, and C6-7 appear to likely be
chronic when compared to 12/26/2016.

Fused facet joint on the right at C2-3.

Soft tissues and spinal canal: As noted above, there is substantial
narrowing of the space for the upper cervical cord/medulla at the
C1-2 level due to anterior epidural pannus/hematoma and the unstable
odontoid fracture.

Disc levels: Spurring causes right foraminal impingement at C2-3 and
C3-4, and left foraminal impingement C3-4 and C4-5. Multilevel mild
to moderate central narrowing of the thecal sac in the upper
cervical spine due to spurring.

Upper chest: Unremarkable

Other: No supplemental non-categorized findings.
IMPRESSION: 1. Unstable type 2 odontoid fracture with up to 6 mm posterior
displacement. Retro odontoid hematoma and/or pannus narrows the AP
diameter of the thecal sac to 5- 6 mm in this vicinity.
2. Fractures involving the posterior elements of C1, with a
segmental fracture of the right lamina with anterior and posterior
components, and a fracture of the left lamina near the spinous
process, resulting in a separate spinous process component.
3. No other cervical spine fractures are identified although there
is multilevel impingement due to cervical spondylosis.
4. No acute intracranial findings.
5. No acute facial fractures.
6. Chronic paranasal sinusitis. Mild soft tissue swelling along the
right forehead.

Critical Value/emergent results were called by telephone at the time
RIEZEL , who verbally acknowledged these results.

## 2022-06-22 IMAGING — CT CT MAXILLOFACIAL W/O CM
3 series · 14 of 47 positions shown, 16 images · non-contrast
Comparison: CT head 12/14/2017 and CT neck 12/26/2016

CLINICAL DATA: Head/neck trauma, cardiac arrest.



[Series 3: facialbone 2.0 st · axial · 0.39mm/px · z∈[-193,-21]mm · 8 of 100 slices shown, 10 images]
[im 7/100  brain]
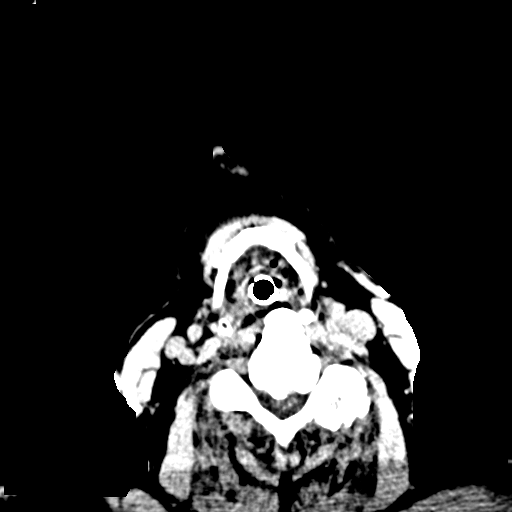
[im 7/100  bone]
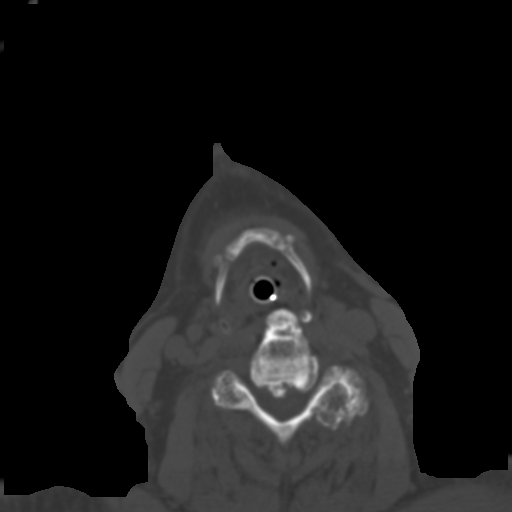
[im 21/100  bone]
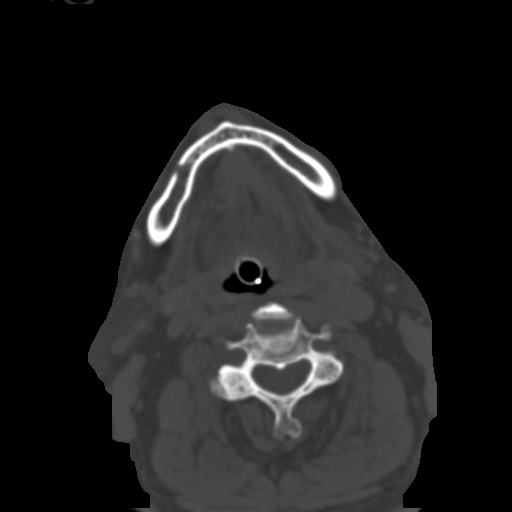
[im 31/100  bone]
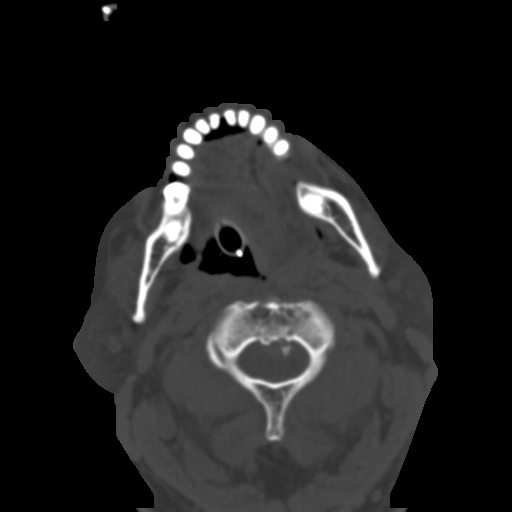
[im 45/100  bone]
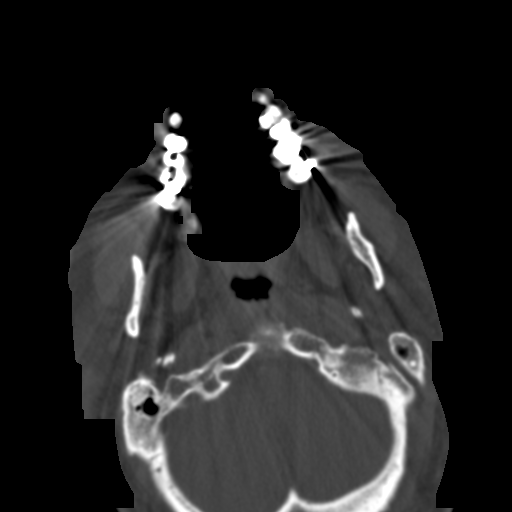
[im 55/100  brain]
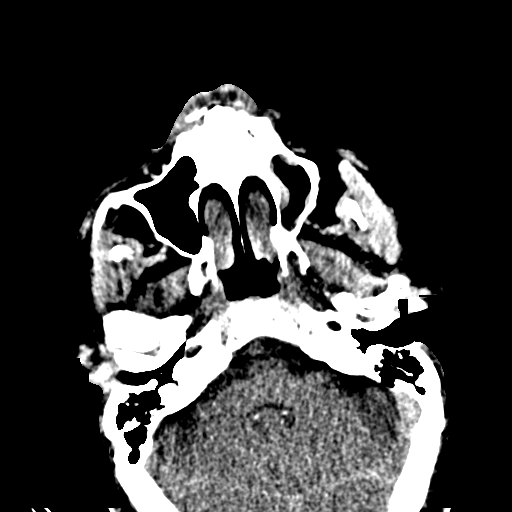
[im 55/100  bone]
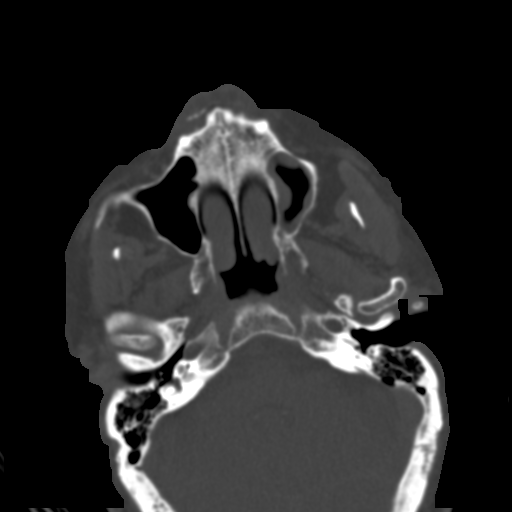
[im 69/100  bone]
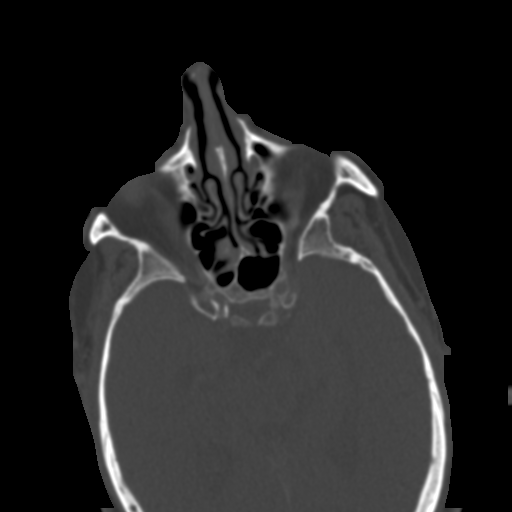
[im 79/100  bone]
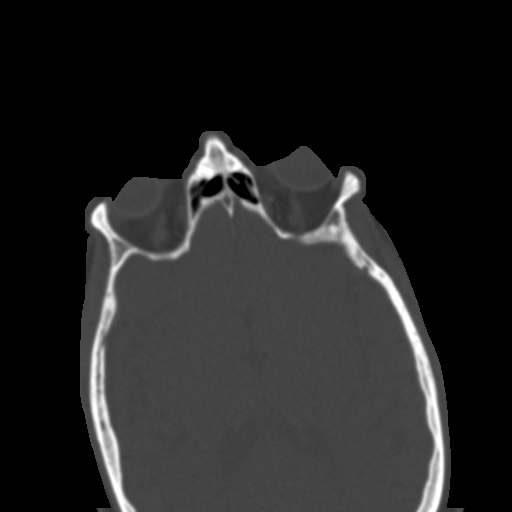
[im 93/100  bone]
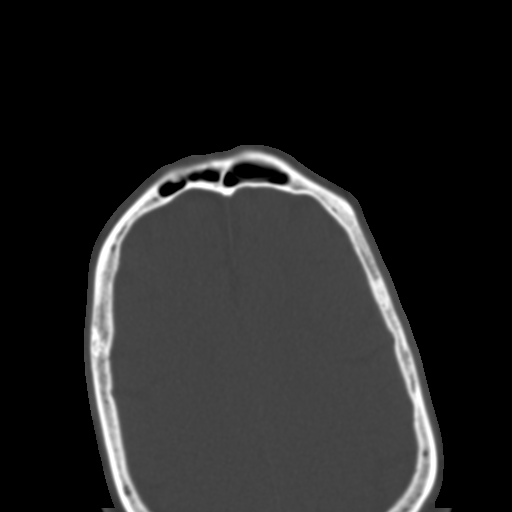

[Series 9: facialbone 2.0 cor st · coronal · 0.43mm/px · 3 of 99 slices shown]
[im 33/99  bone]
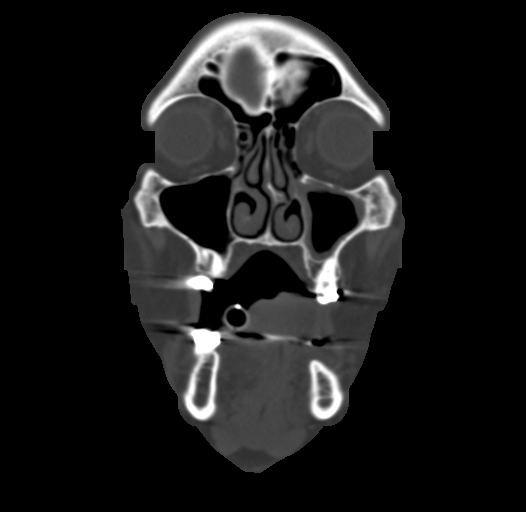
[im 44/99  bone]
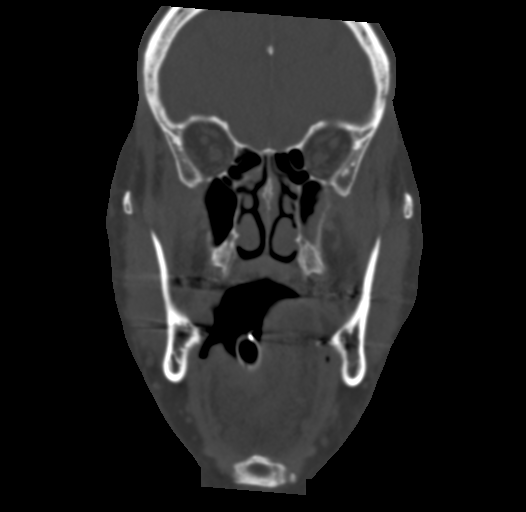
[im 55/99  bone]
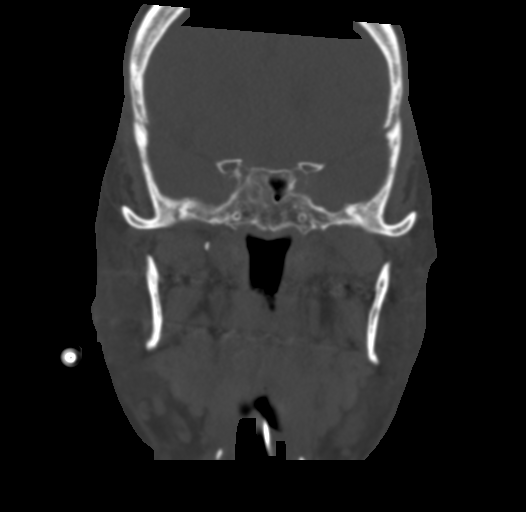

[Series 10: facialbone 2.0 sag st · sagittal · 0.42mm/px · 3 of 84 slices shown]
[im 28/84  bone]
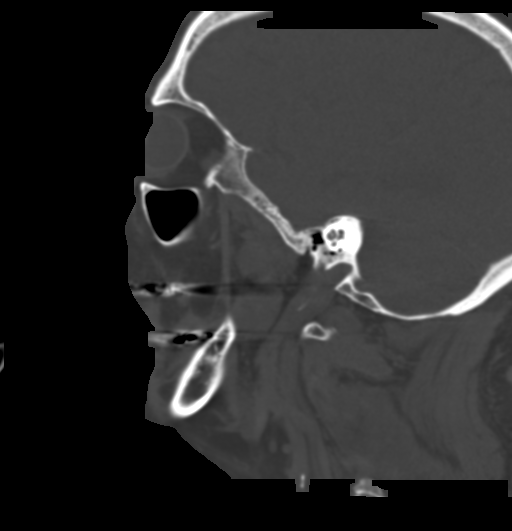
[im 42/84  bone]
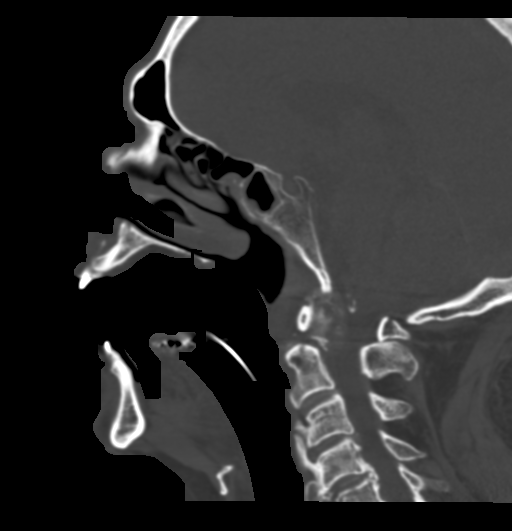
[im 56/84  bone]
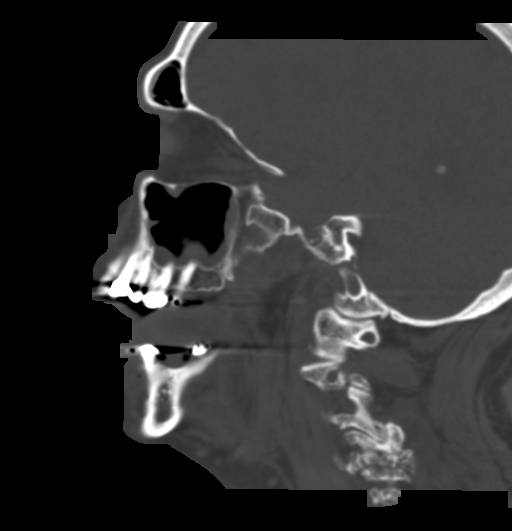

[14 of 47 positions shown; findings below may reference images not displayed]

FINDINGS: CT HEAD FINDINGS

Brain: Periventricular white matter and corona radiata hypodensities
favor chronic ischemic microvascular white matter disease.
Otherwise, the brainstem, cerebellum, cerebral peduncles, thalamus,
basal ganglia, basilar cisterns, and ventricular system appear
within normal limits. No intracranial hemorrhage, mass lesion, or
acute CVA.

Vascular: There is atherosclerotic calcification of the cavernous
carotid arteries bilaterally.

Skull: Upper cervical spine fractures noted, please refer to
cervical spine sections of this report. No acute calvarial fracture
is identified.

Other: The patient is orally intubated.

CT MAXILLOFACIAL FINDINGS

Osseous: Upper cervical spine fracture noted, see report below. No
facial fracture is observed.

Orbits: Unremarkable

Sinuses: There is chronic ethmoid, left frontal, right sphenoid, and
left maxillary sinusitis.

Soft tissues: Mild soft tissue swelling along the right forehead.

CT CERVICAL SPINE FINDINGS

Alignment: No substantial intervertebral malalignment aside from the
subluxation of the unstable odontoid fracture.

Skull base and vertebrae:

C1: Segmental fracture of the ring of C1 with transverse involvement
of the right anterior lamina on image 24 series 10; and segmental
involvement of the posterior lamina spinous process region as shown
on image 25 series 10. On coronal images 30-31 of series 9, vertical
malalignment of the left lamina with the spinous process and right
lamina is observed with about 6 mm of inferior displacement of the
left lamina.

C2: There is an unstable type 2 odontoid fracture with 6 mm of
posterior displacement of the odontoid fragment with respect to the
body of C2. There is high density material potentially reflecting
pannus and/or blood products posterior to the odontoid, with only
about 5-6 mm of anterior posterior space for the cord in this
vicinity.

No other definite cervical spine fractures. Discontinuities in the
anterior osteophytes at C2-3, C3-4, and C6-7 appear to likely be
chronic when compared to 12/26/2016.

Fused facet joint on the right at C2-3.

Soft tissues and spinal canal: As noted above, there is substantial
narrowing of the space for the upper cervical cord/medulla at the
C1-2 level due to anterior epidural pannus/hematoma and the unstable
odontoid fracture.

Disc levels: Spurring causes right foraminal impingement at C2-3 and
C3-4, and left foraminal impingement C3-4 and C4-5. Multilevel mild
to moderate central narrowing of the thecal sac in the upper
cervical spine due to spurring.

Upper chest: Unremarkable

Other: No supplemental non-categorized findings.
IMPRESSION: 1. Unstable type 2 odontoid fracture with up to 6 mm posterior
displacement. Retro odontoid hematoma and/or pannus narrows the AP
diameter of the thecal sac to 5- 6 mm in this vicinity.
2. Fractures involving the posterior elements of C1, with a
segmental fracture of the right lamina with anterior and posterior
components, and a fracture of the left lamina near the spinous
process, resulting in a separate spinous process component.
3. No other cervical spine fractures are identified although there
is multilevel impingement due to cervical spondylosis.
4. No acute intracranial findings.
5. No acute facial fractures.
6. Chronic paranasal sinusitis. Mild soft tissue swelling along the
right forehead.

Critical Value/emergent results were called by telephone at the time
RIEZEL , who verbally acknowledged these results.
